# Patient Record
Sex: Male | Born: 1960 | Race: White | Hispanic: No | Marital: Married | Smoking: Current every day smoker
Health system: Southern US, Community
[De-identification: ages and names within clinical notes are randomized; demographics above are authoritative.]

## PROBLEM LIST (undated history)

## (undated) DIAGNOSIS — R270 Ataxia, unspecified: Secondary | ICD-10-CM

## (undated) DIAGNOSIS — F419 Anxiety disorder, unspecified: Secondary | ICD-10-CM

## (undated) DIAGNOSIS — J8410 Pulmonary fibrosis, unspecified: Secondary | ICD-10-CM

## (undated) DIAGNOSIS — I10 Essential (primary) hypertension: Secondary | ICD-10-CM

## (undated) DIAGNOSIS — E785 Hyperlipidemia, unspecified: Secondary | ICD-10-CM

## (undated) DIAGNOSIS — M069 Rheumatoid arthritis, unspecified: Secondary | ICD-10-CM

## (undated) DIAGNOSIS — M4800 Spinal stenosis, site unspecified: Secondary | ICD-10-CM

## (undated) DIAGNOSIS — I639 Cerebral infarction, unspecified: Secondary | ICD-10-CM

## (undated) DIAGNOSIS — J849 Interstitial pulmonary disease, unspecified: Secondary | ICD-10-CM

## (undated) HISTORY — DX: Spinal stenosis, site unspecified: M48.00

## (undated) HISTORY — PX: CARPAL TUNNEL RELEASE: SHX101

## (undated) HISTORY — DX: Pulmonary fibrosis, unspecified: J84.10

## (undated) HISTORY — DX: Cerebral infarction, unspecified: I63.9

## (undated) HISTORY — DX: Hyperlipidemia, unspecified: E78.5

## (undated) HISTORY — DX: Essential (primary) hypertension: I10

## (undated) HISTORY — DX: Ataxia, unspecified: R27.0

## (undated) HISTORY — PX: CORONARY ANGIOPLASTY: SHX604

## (undated) HISTORY — DX: Rheumatoid arthritis, unspecified: M06.9

## (undated) HISTORY — PX: CORONARY ANGIOPLASTY WITH STENT PLACEMENT: SHX49

## (undated) HISTORY — DX: Anxiety disorder, unspecified: F41.9

## (undated) HISTORY — DX: Interstitial pulmonary disease, unspecified: J84.9

---

## 2014-11-23 DIAGNOSIS — I219 Acute myocardial infarction, unspecified: Secondary | ICD-10-CM

## 2014-11-23 HISTORY — PX: CARDIAC CATHETERIZATION: SHX172

## 2014-11-23 HISTORY — DX: Acute myocardial infarction, unspecified: I21.9

## 2018-05-11 ENCOUNTER — Telehealth: Payer: Self-pay

## 2018-05-11 NOTE — Telephone Encounter (Signed)
LM for patient to call and schedule NP appointment

## 2018-06-14 ENCOUNTER — Ambulatory Visit (INDEPENDENT_AMBULATORY_CARE_PROVIDER_SITE_OTHER): Admitting: Cardiology

## 2018-06-14 ENCOUNTER — Other Ambulatory Visit: Payer: Self-pay

## 2018-06-14 ENCOUNTER — Encounter: Payer: Self-pay | Admitting: Cardiology

## 2018-06-14 VITALS — BP 137/91 | HR 69 | Ht 73.0 in | Wt 275.0 lb

## 2018-06-14 DIAGNOSIS — E782 Mixed hyperlipidemia: Secondary | ICD-10-CM | POA: Diagnosis not present

## 2018-06-14 DIAGNOSIS — I10 Essential (primary) hypertension: Secondary | ICD-10-CM

## 2018-06-14 DIAGNOSIS — I252 Old myocardial infarction: Secondary | ICD-10-CM | POA: Diagnosis not present

## 2018-06-14 DIAGNOSIS — I2510 Atherosclerotic heart disease of native coronary artery without angina pectoris: Secondary | ICD-10-CM | POA: Diagnosis not present

## 2018-06-14 DIAGNOSIS — R0989 Other specified symptoms and signs involving the circulatory and respiratory systems: Secondary | ICD-10-CM

## 2018-06-14 DIAGNOSIS — Z720 Tobacco use: Secondary | ICD-10-CM

## 2018-06-14 MED ORDER — LOSARTAN POTASSIUM 50 MG PO TABS: 50.0000 mg | ORAL_TABLET | Freq: Every day | ORAL | 2 refills | Status: DC

## 2018-06-14 MED ORDER — VARENICLINE TARTRATE 1 MG PO TABS: 1.0000 mg | ORAL_TABLET | Freq: Two times a day (BID) | ORAL | 2 refills | Status: DC

## 2018-06-14 MED ORDER — CHANTIX STARTING MONTH PAK 0.5 MG X 11 & 1 MG X 42 PO TABS: ORAL_TABLET | ORAL | 0 refills | Status: DC

## 2018-06-14 MED ORDER — NITROGLYCERIN 0.4 MG SL SUBL: 0.4000 mg | SUBLINGUAL_TABLET | SUBLINGUAL | 3 refills | Status: DC | PRN

## 2018-06-14 MED ORDER — METOPROLOL SUCCINATE ER 50 MG PO TB24: 50.0000 mg | ORAL_TABLET | Freq: Every day | ORAL | 2 refills | Status: DC

## 2018-06-14 NOTE — Progress Notes (Signed)
Primary Physician/Referring:  Carolee Rota, NP  Patient ID: Fernando Lloyd, male    DOB: [birth year], 58 y.o.   MRN: [id number]  Chief Complaint  Patient presents with  . Other    HPI: Fernando Lloyd  is a 58 y.o. male  with History of rheumatoid arthritis on Humira, long-term steroid use, history of myocardial infarctions when he presented with chest pain on 11/23/2014 and underwent emergent coronary angiography in [place] and had an occluded RCA S/P 3.5 x 28 mm DES with excellent results.  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  He presents here to establish care.  His last echocardiogram on 04/06/2016 had revealed mild decrease in LV systolic function at 15-95% without wall motion abnormality and no significant valvular abnormality. Calculated EF 50%  Presents to establish care, he lost his job but moved to [place] a few months ago and was out of all his medications.  He was sent to to see me on an urgent basis to evaluate his cardiac status.  States that he is active but does not do exercise has not had any chest pain but continues to smoke about half pack to pack of cigarettes a day, denies dyspnea or palpitations or dizziness or syncope.  Past Medical History:  Diagnosis Date  . Anxiety   . CHF (congestive heart failure) ([place])   . Hyperlipidemia   . Hypertension   . Myocardial infarct ([place])   . Rheumatoid arthritis [facility])     Past Surgical History:  Procedure Laterality Date  . CARDIAC CATHETERIZATION N/A 11/23/2014  . CARPAL TUNNEL RELEASE    . CORONARY ANGIOPLASTY     2016    Social History   Socioeconomic History  . Marital status: Married    Spouse name: Not on file  . Number of children: 1  . Years of education: Not on file  . Highest education level: Not on file  Occupational History  . Not on file  Social Needs  . Financial resource strain: Not on file  . Food insecurity   Worry: Not on file    Inability: Not on file  . Transportation needs    Medical: Not on file    Non-medical: Not on file  Tobacco Use  . Smoking status: Current Every Day Smoker    Packs/day: 0.50    Years: 25.00    Pack years: 12.50    Types: Cigarettes  . Smokeless tobacco: Never Used  Substance and Sexual Activity  . Alcohol use: Yes  . Drug use: Not on file  . Sexual activity: Not on file  Lifestyle  . Physical activity    Days per week: Not on file    Minutes per session: Not on file  . Stress: Not on file  Relationships  . Social Herbalist on phone: Not on file    Gets together: Not on file    Attends religious service: Not on file    Active member of club or organization: Not on file    Attends meetings of clubs or organizations: Not on file    Relationship status: Not on file  . Intimate partner violence    Fear of current or ex partner: Not on file    Emotionally abused: Not on file    Physically abused: Not on file    Forced sexual activity: Not on file  Other Topics Concern  . Not on file  Social History Narrative  .  Not on file    Review of Systems  Constitution: Negative for chills, decreased appetite, malaise/fatigue and weight gain.  Cardiovascular: Negative for dyspnea on exertion, leg swelling and syncope.  Endocrine: Negative for cold intolerance.  Hematologic/Lymphatic: Does not bruise/bleed easily.  Musculoskeletal: Positive for joint pain (RA). Negative for joint swelling.  Gastrointestinal: Negative for abdominal pain, anorexia, change in bowel habit, hematochezia and melena.  Neurological: Negative for headaches and light-headedness.  Psychiatric/Behavioral: Negative for depression and substance abuse.  All other systems reviewed and are negative.     Objective  Blood pressure (!) 137/91, pulse 69, height _0  (1.854 m), weight 275 lb (124.7 kg), SpO2 97 %. Body mass index is 36.28 kg/m.    Physical Exam  Constitutional: He  appears well-developed. No distress.  Moderately obese  HENT:  Head: Atraumatic.  Eyes: Conjunctivae are normal.  Neck: Neck supple. No JVD present. No thyromegaly present.  Cardiovascular: Normal rate, regular rhythm, normal heart sounds, intact distal pulses and normal pulses. Exam reveals no gallop.  No murmur heard. Pulses:      Carotid pulses are on the right side with bruit and on the left side with bruit. 2+ bilateral pitting edema  Pulmonary/Chest: Effort normal and breath sounds normal.  Abdominal: Soft. Bowel sounds are normal.  Musculoskeletal: Normal range of motion.  Neurological: He is alert.  Skin: Skin is warm and dry.  Psychiatric: He has a normal mood and affect.   Radiology: No results found.  Laboratory examination:   Not available   Medications   Current Meds  Medication Sig  . Adalimumab (HUMIRA) 10 MG/0.2ML PSKT Inject into the skin once a week.   . ALPRAZolam (XANAX) 0.25 MG tablet Take 0.25 mg by mouth 2 (two) times daily as needed.  [name] aspirin EC 81 MG tablet Take 81 mg by mouth daily.  [name] atorvastatin (LIPITOR) 40 MG tablet Take 40 mg by mouth daily.  . clopidogrel (PLAVIX) 75 MG tablet Take 75 mg by mouth daily.  [name] leflunomide (ARAVA) 20 MG tablet Take 1 tablet by mouth daily.  . predniSONE (DELTASONE) 10 MG tablet Take 1 tablet by mouth daily.  . [DISCONTINUED] amiodarone (PACERONE) 200 MG tablet Take 200 mg by mouth daily.  . [DISCONTINUED] metoprolol tartrate (LOPRESSOR) 12.5 mg TABS tablet Take 12.5 mg by mouth 2 (two) times daily.    Cardiac Studies:   [facility] [place] LV size normal, mild decrease in LV wall motion, EF 45-50%.  Calculated EF 50%.  No significant valve motion abnormality.  Coronary angiogram 11/23/2014 at [facility] in [place] S/P 3.5 x 28 mm DES from ostium to the midsegment.  Circumflex midsegment 40% stenosis, LAD normal.  Exercise sestamibi stress test  06/11/2015: Exercised for 9 minutes and achieved 10.1 mets.  There was no evidence of ischemia.  Small mild mid to base anteroseptal wall fixed defect consider wall motion artifact.  Gated images reveal EF 56%.  Assessment   1. Coronary artery disease involving native coronary artery of native heart without angina pectoris   2. Mixed hyperlipidemia   3. History of MI (myocardial infarction) 11/23/2014 S/P 3.5x28 mm DES to RCA   4. Tobacco use   5. Essential hypertension   6. Bilateral carotid bruits    Orders Placed This Encounter  Procedures  . CMP14+EGFR  . Lipid Panel With LDL/HDL Ratio  . Lipoprotein A (LPA)  . TSH  . PCV MYOCARDIAL PERFUSION WITH LEXISCAN  .  EKG 12-Lead    EKG 06/14/2018: Normal sinus rhythm with rate of 70 bpm, left axis deviation, left anterior fascicular block.  Incomplete right bundle branch block.  Recommendations:   Patient presents to evaluate for coronary artery disease, referred to me by Ms. Kerin Salen NP.  Patient has been on amiodarone since 2016 since he had a ventricular fibrillation arrest during angioplasty.  I have discontinued this. Increase metoprolol dosage, change from artery to succinate, 50 mg daily, also add losartan 50 mg daily as his hypertension is uncontrolled.  S/L NTG was prescribed and explained how to and when to use it and to notify us if there is change in frequency of use. Interaction with cialis-like agents (if applicable was discussed).   Tobacco usage patient discussed in addition to the 60 minute encounter, greater than 50% of the time spent a minute face-to-face interaction.  Tobacco use cessation discussed for additional 5 minutes, Asian is motivated, Chantix was prescribed.  He also uses Ecotrin patches.  Due to significant risk factors including him that arthritis, ongoing tobacco use disorder, uncontrolled hypertension, had been on no medications for several months, he does have increases for CAD especially in view  of rest to mid circumflex disease.  His last stress test was in 2017.  I'll consider doing a Nuclear stress test.  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 I do not want to do treadmill stress testing in view of Coreg 19.  Weight loss was also discussed at length.  I would like to see him back in 6 weeks.  Lab orders have been placed.  Adrian Prows, MD, [facility] 06/14/2018, 3:35 PM [facility]. [place] Pager: [phone] Office: [phone] If no answer Cell [phone]

## 2018-07-16 ENCOUNTER — Other Ambulatory Visit

## 2018-07-17 ENCOUNTER — Other Ambulatory Visit

## 2018-07-24 ENCOUNTER — Ambulatory Visit: Admitting: Cardiology

## 2018-11-09 ENCOUNTER — Other Ambulatory Visit: Payer: Self-pay | Admitting: Cardiology

## 2018-11-09 DIAGNOSIS — I10 Essential (primary) hypertension: Secondary | ICD-10-CM

## 2018-11-14 ENCOUNTER — Encounter: Payer: Self-pay | Admitting: *Deleted

## 2018-11-15 ENCOUNTER — Ambulatory Visit (INDEPENDENT_AMBULATORY_CARE_PROVIDER_SITE_OTHER): Admitting: Neurology

## 2018-11-15 ENCOUNTER — Other Ambulatory Visit: Payer: Self-pay

## 2018-11-15 ENCOUNTER — Encounter: Payer: Self-pay | Admitting: Neurology

## 2018-11-15 VITALS — BP 132/82 | HR 76 | Temp 97.8°F | Ht 73.0 in | Wt 262.0 lb

## 2018-11-15 DIAGNOSIS — I639 Cerebral infarction, unspecified: Secondary | ICD-10-CM

## 2018-11-15 MED ORDER — CLOPIDOGREL BISULFATE 75 MG PO TABS: 75.0000 mg | ORAL_TABLET | Freq: Every day | ORAL | 11 refills | Status: DC

## 2018-11-15 NOTE — Progress Notes (Signed)
PATIENT: Fernando Lloyd DOB: 07-04-1960  Chief Complaint  Patient presents with  . Gait Problem    Reports weakness in his right arm and right leg, along with a wobbly gait.  His symptoms started around six weeks ago and have started to mildly improve without treatment.  [name] PCP    Carolee Rota, NP     HISTORICAL  Fernando Lloyd is a 58 year old male, seen in request by his primary care nurse practitioner Carolee Rota for evaluation of sudden onset gait abnormality, initial evaluation was on November 15, 2018.  I have reviewed and summarized the referring note from the referring physician.  He has past medical history of hypertension, hyperlipidemia, coronary artery disease, smokes 1-1/2 pack a day for 30 years, drinks beers regularly.  Rheumatoid arthritis  On September 27, 2018, he was at his baseline, after sitting the chair working for few hours, when he get up, he had sudden onset dizziness, room was spinning, he has unsteady gait, he had mild slurred speech last for 2 days, he also noticed to right arm and leg weakness, which slowly improved over the past few weeks, but he still has mild unsteady gait  He has been taking aspirin daily  REVIEW OF SYSTEMS: Full 14 system review of systems performed and notable only for as above All other review of systems were negative.  ALLERGIES: No Known Allergies  HOME MEDICATIONS: Current Outpatient Medications  Medication Sig Dispense Refill  . Adalimumab (HUMIRA) 10 MG/0.2ML PSKT Inject into the skin once a week.     . ALPRAZolam (XANAX) 0.25 MG tablet Take 0.25 mg by mouth 2 (two) times daily as needed.    [name] aspirin EC 81 MG tablet Take 81 mg by mouth daily.    [name] losartan (COZAAR) 50 MG tablet Take 1 tablet by mouth once daily 30 tablet 0  . metoprolol succinate (TOPROL-XL) 50 MG 24 hr tablet TAKE 1 TABLET BY MOUTH ONCE DAILY TAKE  WITH  OR  IMMEDIATELY FOLLOWING A MEAL 30 tablet 0  . predniSONE (DELTASONE) 10 MG tablet  Take 1 tablet by mouth daily.    . rosuvastatin (CRESTOR) 40 MG tablet Take 40 mg by mouth daily.    . nitroGLYCERIN (NITROSTAT) 0.4 MG SL tablet Place 1 tablet (0.4 mg total) under the tongue every 5 (five) minutes as needed for up to 25 days for chest pain. 25 tablet 3   No current facility-administered medications for this visit.     PAST MEDICAL HISTORY: Past Medical History:  Diagnosis Date  . Anxiety   . Ataxia   . Hyperlipidemia   . Hypertension   . Myocardial infarct ([place]) 11/23/2014   RCA stent 3.5x23 DES in [place]  . Rheumatoid arthritis ([place])     PAST SURGICAL HISTORY: Past Surgical History:  Procedure Laterality Date  . CARDIAC CATHETERIZATION N/A 11/23/2014  . CARPAL TUNNEL RELEASE    . CORONARY ANGIOPLASTY     2016    FAMILY HISTORY: Family History  Problem Relation Age of Onset  . Dementia Mother   . Breast cancer Mother   . Other Father        unsure of history    SOCIAL HISTORY: Social History   Socioeconomic History  . Marital status: Married    Spouse name: Not on file  . Number of children: 1  . Years of education: some college  . Highest education level: Not on file  Occupational History  . Not on file  Social  Needs  . Financial resource strain: Not on file  . Food insecurity    Worry: Not on file    Inability: Not on file  . Transportation needs    Medical: Not on file    Non-medical: Not on file  Tobacco Use  . Smoking status: Current Every Day Smoker    Packs/day: 1.50    Years: 25.00    Pack years: 37.50    Types: Cigarettes  . Smokeless tobacco: Never Used  Substance and Sexual Activity  . Alcohol use: Yes    Comment: occasional  . Drug use: Never  . Sexual activity: Not on file  Lifestyle  . Physical activity    Days per week: Not on file    Minutes per session: Not on file  . Stress: Not on file  Relationships  . Social Musician on phone: Not on file    Gets together: Not on file    Attends religious  service: Not on file    Active member of club or organization: Not on file    Attends meetings of clubs or organizations: Not on file    Relationship status: Not on file  . Intimate partner violence    Fear of current or ex partner: Not on file    Emotionally abused: Not on file    Physically abused: Not on file    Forced sexual activity: Not on file  Other Topics Concern  . Not on file  Social History Narrative   Lives at home with his wife.   Right-handed.   1-2 cups per day.     PHYSICAL EXAM   Vitals:   11/15/18 1504  Temp: 97.8 F (36.6 C)  Weight: 262 lb (118.8 kg)  Height: 6\' 1"  (1.854 m)    Not recorded      Body mass index is 34.57 kg/m.  PHYSICAL EXAMNIATION:  Gen: NAD, conversant, well nourised, well groomed                     Cardiovascular: Regular rate rhythm, no peripheral edema, warm, nontender. Eyes: Conjunctivae clear without exudates or hemorrhage Neck: Supple, no carotid bruits. Pulmonary: Clear to auscultation bilaterally   NEUROLOGICAL EXAM:  MENTAL STATUS: Speech:    Speech is normal; fluent and spontaneous with normal comprehension.  Cognition:     Orientation to time, place and person     Normal recent and remote memory     Normal Attention span and concentration     Normal Language, naming, repeating,spontaneous speech     Fund of knowledge   CRANIAL NERVES: CN II: Visual fields are full to confrontation.  Pupils are round equal and briskly reactive to light. CN III, IV, VI: extraocular movement are normal. No ptosis. CN V: Facial sensation is intact to pinprick in all 3 divisions bilaterally. Corneal responses are intact.  CN VII: Face is symmetric with normal eye closure and smile. CN VIII: Hearing is normal to causal conversation. CN IX, X: Palate elevates symmetrically. Phonation is normal. CN XI: Head turning and shoulder shrug are intact CN XII: Tongue is midline with normal movements and no atrophy.  MOTOR: Mild right  arm pronation drift, fixation on rapid rotating movement, mild drift of the right lower extremity  REFLEXES: Hyperreflexia of right upper and lower extremity, right plantar responses were extensor  SENSORY: Intact to light touch, pinprick, positional sensation and vibratory sensation are intact in fingers and toes.  COORDINATION: Mild  dysmetria on right heel-to-shin  GAIT/STANCE: Posture is normal. Gait is steady with normal steps, base, arm swing, and turning. Heel and toe walking are normal.  Mild difficulty with tandem walking   DIAGNOSTIC DATA (LABS, IMAGING, TESTING) - I reviewed patient records, labs, notes, testing and imaging myself where available.   ASSESSMENT AND PLAN  Tarrick Carras is a 58 y.o. male   Acute onset of vertigo, gait abnormality, right arm, leg weakness, dysarthria  Most consistent with acute stroke in left brainstem/cerebellum  He has multiple vascular risk factors, hypertension, hyperlipidemia, longtime smoker, coronary artery disease,  Proceed with MRI of the brain, MRA of brain and neck,  Stop aspirin, started Plavix,  Echocardiogram  Bring laboratory evaluation at next follow-up visit   Levert Feinstein, M.D. Ph.D.  [facility] [address] [place], [place] [address] Ph: [phone] Fax: [phone]  CC: Mitzi Hansen, NP

## 2018-11-19 ENCOUNTER — Telehealth: Payer: Self-pay

## 2018-11-19 NOTE — Telephone Encounter (Signed)
Patient sent to GI via email, their phone number is [phone].

## 2018-12-09 ENCOUNTER — Other Ambulatory Visit: Payer: Self-pay | Admitting: Cardiology

## 2018-12-09 DIAGNOSIS — I10 Essential (primary) hypertension: Secondary | ICD-10-CM

## 2018-12-15 ENCOUNTER — Ambulatory Visit
Admission: RE | Admit: 2018-12-15 | Discharge: 2018-12-15 | Disposition: A | Discharge status: Home / Self Care | Source: Ambulatory Visit | Attending: Neurology | Admitting: Neurology

## 2018-12-15 ENCOUNTER — Other Ambulatory Visit: Payer: Self-pay

## 2018-12-15 DIAGNOSIS — I639 Cerebral infarction, unspecified: Secondary | ICD-10-CM

## 2018-12-15 MED ORDER — GADOBENATE DIMEGLUMINE 529 MG/ML IV SOLN
20.0000 mL | Freq: Once | INTRAVENOUS | Status: AC | PRN
  Administered 2018-12-15: 20 mL via INTRAVENOUS

## 2018-12-17 ENCOUNTER — Telehealth: Payer: Self-pay | Admitting: Neurology

## 2018-12-17 NOTE — Telephone Encounter (Signed)
IMPRESSION: This MRI of the brain without contrast shows the following: 1.  Chronic lacunar infarction in the left mid pons. 2.  Scattered T2/flair hyperintense foci in the hemispheres, predominantly in the deep white matter consistent with chronic microvascular ischemic change. 3.  There were no acute findings on the study.   Please call patient, MRI of the brain showed lacunar infarction in the left mid pons, this can explain her for sudden onset of the right-sided weakness, slurred speech, dizziness in September  MRI of the brain also showed supratentorium small vessel disease.  MRA of the neck showed mild proximal intimal carotid artery stenosis, right vertebral artery is dominant  MRA of the head showed no large vessel disease.  Should continue Plavix daily

## 2018-12-17 NOTE — Telephone Encounter (Signed)
I spoke to the patient and reviewed the results below.  He verbalized understanding and will continue his Plavix.  He is also taking his blood pressure and cholesterol medications, as prescribed.  He will keep his pending follow up here.

## 2019-01-06 ENCOUNTER — Other Ambulatory Visit: Payer: Self-pay | Admitting: Cardiology

## 2019-01-06 DIAGNOSIS — I10 Essential (primary) hypertension: Secondary | ICD-10-CM

## 2019-01-09 ENCOUNTER — Telehealth: Payer: Self-pay

## 2019-02-10 ENCOUNTER — Other Ambulatory Visit: Payer: Self-pay | Admitting: Cardiology

## 2019-02-10 DIAGNOSIS — I10 Essential (primary) hypertension: Secondary | ICD-10-CM

## 2019-02-15 ENCOUNTER — Other Ambulatory Visit: Payer: Self-pay

## 2019-02-21 ENCOUNTER — Ambulatory Visit: Admitting: Adult Health

## 2019-03-16 ENCOUNTER — Ambulatory Visit: Attending: Internal Medicine

## 2019-03-16 DIAGNOSIS — Z23 Encounter for immunization: Secondary | ICD-10-CM

## 2019-03-16 NOTE — Progress Notes (Signed)
   Covid-19 Vaccination Clinic  Name:  Fernando Lloyd    MRN: [id number] DOB: 08-17-1960  03/16/2019  [name] was observed post Covid-19 immunization for 15 minutes without incident. He was provided with Vaccine Information Sheet and instruction to access the V-Safe system.   [name] was instructed to call 911 with any severe reactions post vaccine: [name] Difficulty breathing  . Swelling of face and throat  . A fast heartbeat  . A bad rash all over body  . Dizziness and weakness   Immunizations Administered    Name Date Dose VIS Date Route   Pfizer COVID-19 Vaccine 03/16/2019  1:52 PM 0.3 mL 12/14/2018 Intramuscular   Manufacturer: [employer], [employer]   Lot: [id number]   NDC: [address]-2

## 2019-03-24 ENCOUNTER — Other Ambulatory Visit: Payer: Self-pay | Admitting: Cardiology

## 2019-03-24 DIAGNOSIS — I10 Essential (primary) hypertension: Secondary | ICD-10-CM

## 2019-04-09 ENCOUNTER — Ambulatory Visit: Attending: Internal Medicine

## 2019-04-09 DIAGNOSIS — Z23 Encounter for immunization: Secondary | ICD-10-CM

## 2019-04-09 NOTE — Progress Notes (Signed)
   Covid-19 Vaccination Clinic  Name:  Fernando Lloyd    MRN: [id number] DOB: 1960-04-29  04/09/2019  Mr. [name] was observed post Covid-19 immunization for 15 minutes without incident. He was provided with Vaccine Information Sheet and instruction to access the V-Safe system.   Mr. [name] was instructed to call 911 with any severe reactions post vaccine: [name] Difficulty breathing  . Swelling of face and throat  . A fast heartbeat  . A bad rash all over body  . Dizziness and weakness   Immunizations Administered    Name Date Dose VIS Date Route   Pfizer COVID-19 Vaccine 04/09/2019  1:08 PM 0.3 mL 12/14/2018 Intramuscular   Manufacturer: [employer], [employer]   Lot: [id number]   NDC: [address]-5

## 2019-07-06 ENCOUNTER — Other Ambulatory Visit: Payer: Self-pay | Admitting: Cardiology

## 2019-08-24 ENCOUNTER — Other Ambulatory Visit: Payer: Self-pay | Admitting: Cardiology

## 2019-08-24 DIAGNOSIS — I10 Essential (primary) hypertension: Secondary | ICD-10-CM

## 2019-10-25 ENCOUNTER — Emergency Department (HOSPITAL_COMMUNITY)
Admission: EM | Admit: 2019-10-25 | Discharge: 2019-10-25 | Disposition: A | Discharge status: Home / Self Care | Attending: Emergency Medicine | Admitting: Emergency Medicine

## 2019-10-25 ENCOUNTER — Emergency Department (HOSPITAL_COMMUNITY)

## 2019-10-25 ENCOUNTER — Other Ambulatory Visit: Payer: Self-pay

## 2019-10-25 ENCOUNTER — Encounter (HOSPITAL_COMMUNITY): Payer: Self-pay | Admitting: Pharmacy Technician

## 2019-10-25 DIAGNOSIS — I10 Essential (primary) hypertension: Secondary | ICD-10-CM | POA: Insufficient documentation

## 2019-10-25 DIAGNOSIS — Z20822 Contact with and (suspected) exposure to covid-19: Secondary | ICD-10-CM | POA: Insufficient documentation

## 2019-10-25 DIAGNOSIS — Z79899 Other long term (current) drug therapy: Secondary | ICD-10-CM | POA: Insufficient documentation

## 2019-10-25 DIAGNOSIS — R0789 Other chest pain: Secondary | ICD-10-CM | POA: Diagnosis present

## 2019-10-25 DIAGNOSIS — Z7982 Long term (current) use of aspirin: Secondary | ICD-10-CM | POA: Insufficient documentation

## 2019-10-25 DIAGNOSIS — Z8673 Personal history of transient ischemic attack (TIA), and cerebral infarction without residual deficits: Secondary | ICD-10-CM | POA: Diagnosis not present

## 2019-10-25 DIAGNOSIS — F17210 Nicotine dependence, cigarettes, uncomplicated: Secondary | ICD-10-CM | POA: Insufficient documentation

## 2019-10-25 DIAGNOSIS — I2510 Atherosclerotic heart disease of native coronary artery without angina pectoris: Secondary | ICD-10-CM | POA: Diagnosis not present

## 2019-10-25 DIAGNOSIS — R079 Chest pain, unspecified: Secondary | ICD-10-CM

## 2019-10-25 LAB — RESPIRATORY PANEL BY RT PCR (FLU A&B, COVID)
Influenza A by PCR: NEGATIVE
Influenza B by PCR: NEGATIVE
SARS Coronavirus 2 by RT PCR: NEGATIVE

## 2019-10-25 LAB — CBC
HCT: 45.5 % (ref 39.0–52.0)
Hemoglobin: 15.1 g/dL (ref 13.0–17.0)
MCH: 33.2 pg (ref 26.0–34.0)
MCHC: 33.2 g/dL (ref 30.0–36.0)
MCV: 100 fL (ref 80.0–100.0)
Platelets: 307 10*3/uL (ref 150–400)
RBC: 4.55 MIL/uL (ref 4.22–5.81)
RDW: 12.4 % (ref 11.5–15.5)
WBC: 15.3 10*3/uL — ABNORMAL HIGH (ref 4.0–10.5)
nRBC: 0 % (ref 0.0–0.2)

## 2019-10-25 LAB — TROPONIN I (HIGH SENSITIVITY)
Troponin I (High Sensitivity): 8 ng/L (ref <18)
Troponin I (High Sensitivity): 8 ng/L (ref <18)

## 2019-10-25 LAB — BASIC METABOLIC PANEL
Anion gap: 9 (ref 5–15)
BUN: 12 mg/dL (ref 6–20)
CO2: 23 mmol/L (ref 22–32)
Calcium: 9.3 mg/dL (ref 8.9–10.3)
Chloride: 107 mmol/L (ref 98–111)
Creatinine, Ser: 1.15 mg/dL (ref 0.61–1.24)
GFR, Estimated: >60 mL/min (ref >60)
Glucose, Bld: 90 mg/dL (ref 70–99)
Potassium: 4.2 mmol/L (ref 3.5–5.1)
Sodium: 139 mmol/L (ref 135–145)

## 2019-10-25 LAB — BRAIN NATRIURETIC PEPTIDE: B Natriuretic Peptide: 61.2 pg/mL (ref 0.0–100.0)

## 2019-10-25 MED ORDER — PANTOPRAZOLE SODIUM 40 MG IV SOLR
40.0000 mg | Freq: Once | INTRAVENOUS | Status: AC
  Administered 2019-10-25: 40 mg via INTRAVENOUS
  Filled 2019-10-25: qty 40

## 2019-10-25 MED ORDER — PANTOPRAZOLE SODIUM 20 MG PO TBEC: 20.0000 mg | DELAYED_RELEASE_TABLET | Freq: Every day | ORAL | 0 refills | Status: DC

## 2019-10-25 MED ORDER — ISOSORBIDE MONONITRATE ER 60 MG PO TB24: 60.0000 mg | ORAL_TABLET | Freq: Every day | ORAL | 0 refills | Status: AC

## 2019-10-25 MED ORDER — ISOSORBIDE MONONITRATE ER 30 MG PO TB24
60.0000 mg | ORAL_TABLET | Freq: Every day | ORAL | Status: DC
  Administered 2019-10-25: 60 mg via ORAL
  Filled 2019-10-25: qty 2

## 2019-10-25 NOTE — ED Triage Notes (Signed)
Uncomfortable chest pain, pt reports dizziness (stiff feeling on left side of chest). Pt denies sob.

## 2019-10-25 NOTE — ED Provider Notes (Addendum)
[facility] EMERGENCY DEPARTMENT Provider Note   CSN: [id number] Arrival date & time: 10/25/19  0626     History Chief Complaint  Patient presents with  . Chest Pain    Fernando Lloyd is a 59 y.o. 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  Additionally patient reports that over the past several weeks he has developed some lightheadedness whenever he exerts himself.  Denies fall/injury, fever/chills, headache, vision changes, neck pain, cough/shortness of breath, hemoptysis, abdominal pain, nausea/vomiting, diarrhea, numbness, new weakness or any additional concerns.  Of note patient has had three doses Covid vaccine last dose around 2 weeks ago.  HPI     Past Medical History:  Diagnosis Date  . Anxiety   . Ataxia   . Hyperlipidemia   . Hypertension   . Myocardial infarct (HCC) 11/23/2014   RCA stent 3.5x23 DES in [place]  . Rheumatoid arthritis [facility])     Patient Active Problem List   Diagnosis Date Noted  . Cerebrovascular accident (CVA) (HCC) 11/15/2018  . Coronary artery disease involving native coronary artery of native heart without angina pectoris 06/14/2018    Past Surgical History:  Procedure Laterality Date  . CARDIAC CATHETERIZATION N/A 11/23/2014  . CARPAL TUNNEL RELEASE    . CORONARY ANGIOPLASTY     2016       Family History  Problem Relation Age of Onset  . Dementia Mother   . Breast cancer Mother   . Other Father        unsure of history    Social History   Tobacco Use  . Smoking status:  Current Every Day Smoker    Packs/day: 1.50    Years: 25.00    Pack years: 37.50    Types: Cigarettes  . Smokeless tobacco: Never Used  Vaping Use  . Vaping Use: Never used  Substance Use Topics  . Alcohol use: Yes    Comment: occasional  . Drug use: Never    Home Medications Prior to Admission medications   Medication Sig Start Date End Date Taking? Authorizing Provider  ALPRAZolam (XANAX) 0.25 MG tablet Take 0.25 mg by mouth See admin instructions. Take 0.25 mg by mouth one to two times a day as needed for anxiety 04/30/18  Yes [provider]  aspirin (BAYER ASPIRIN) 325 MG EC tablet Take 325 mg by mouth daily.   Yes [provider]  clopidogrel (PLAVIX) 75 MG tablet Take 1 tablet (75 mg total) by mouth daily. 11/15/18  Yes Levert Feinstein, MD  losartan (COZAAR) 50 MG tablet Take 1 tablet by mouth once daily Patient taking differently: Take 50 mg by mouth daily.  08/26/19  Yes Yates Decamp, MD  metoprolol succinate (TOPROL-XL) 50 MG 24 hr tablet TAKE 1 TABLET BY MOUTH ONCE DAILY WITH A MEAL OR  IMMEDIATELY  FOLLOWING  A  MEAL Patient taking differently: Take 50 mg by mouth daily.  07/10/19  Yes Yates Decamp, MD  predniSONE (DELTASONE) 10 MG tablet Take 10 mg by mouth daily as needed (AS DIRECTED).  05/12/18  Yes [provider]  RINVOQ 15 MG TB24 Take 15 mg by  mouth daily.  10/18/19  Yes [provider]  rosuvastatin (CRESTOR) 40 MG tablet Take 40 mg by mouth at bedtime.    Yes [provider]  Adalimumab (HUMIRA) 10 MG/0.2ML PSKT Inject into the skin once a week.  Patient not taking: Reported on 10/25/2019    [provider]  gabapentin (NEURONTIN) 300 MG capsule Take 300 mg by mouth daily. 10/08/19   [provider]  isosorbide mononitrate (IMDUR) 60 MG 24 hr tablet Take 1 tablet (60 mg total) by mouth daily. 10/25/19 11/24/19  Harlene Salts A, PA-C  nitroGLYCERIN (NITROSTAT) 0.4 MG SL tablet Place 1 tablet (0.4 mg total) under the  tongue every 5 (five) minutes as needed for up to 25 days for chest pain. 06/14/18 10/25/19  Yates Decamp, MD  pantoprazole (PROTONIX) 20 MG tablet Take 1 tablet (20 mg total) by mouth daily. 10/25/19   Bill Salinas, PA-C    Allergies    Patient has no known allergies.  Review of Systems   Review of Systems Ten systems are reviewed and are negative for acute change except as noted in the HPI  Physical Exam Updated Vital Signs BP 110/72 (BP Location: Right Arm)   Pulse (!) 53   Temp 98.7 F (37.1 C) (Oral)   Resp 18   Ht 6\' 1"  (1.854 m)   Wt 104.3 kg   SpO2 98%   BMI 30.34 kg/m   Physical Exam Constitutional:      General: He is not in acute distress.    Appearance: Normal appearance. He is well-developed. He is not ill-appearing or diaphoretic.  HENT:     Head: Normocephalic and atraumatic.  Eyes:     General: Vision grossly intact. Gaze aligned appropriately.     Pupils: Pupils are equal, round, and reactive to light.  Neck:     Trachea: Trachea and phonation normal.  Cardiovascular:     Rate and Rhythm: Normal rate and regular rhythm.  Pulmonary:     Effort: Pulmonary effort is normal. No respiratory distress.     Breath sounds: Normal breath sounds.  Abdominal:     General: There is no distension.     Palpations: Abdomen is soft.     Tenderness: There is no abdominal tenderness. There is no guarding or rebound.  Musculoskeletal:        General: Normal range of motion.     Cervical back: Normal range of motion.     Right lower leg: No edema.     Left lower leg: No edema.  Skin:    General: Skin is warm and dry.  Neurological:     Mental Status: He is alert.     GCS: GCS eye subscore is 4. GCS verbal subscore is 5. GCS motor subscore is 6.     Comments: Speech is clear and goal oriented, follows commands Major Cranial nerves without deficit, no facial droop Moves extremities without ataxia, coordination intact  Psychiatric:        Behavior: Behavior  normal.     ED Results / Procedures / Treatments   Labs (all labs ordered are listed, but only abnormal results are displayed) Labs Reviewed  CBC - Abnormal; Notable for the following components:      Result Value   WBC 15.3 (*)    All other components within normal limits  RESPIRATORY PANEL BY RT PCR (FLU A&B, COVID)  BASIC METABOLIC PANEL  BRAIN NATRIURETIC PEPTIDE  TROPONIN I (HIGH SENSITIVITY)  TROPONIN I (  HIGH SENSITIVITY)    EKG EKG Interpretation  Date/Time:  Friday October 25 2019 07:56:04 EDT Ventricular Rate:  60 PR Interval:  170 QRS Duration: 90 QT Interval:  406 QTC Calculation: 406 R Axis:   -19 Text Interpretation: Normal sinus rhythm Normal ECG No STEMI Confirmed by Alvester Chou [phone]) on 10/25/2019 8:05:04 AM Also confirmed by Alvester Chou [phone]), editor Elita Quick [phone])  on 10/25/2019 10:02:48 AM   Radiology DG Chest 2 View  Result Date: 10/25/2019 CLINICAL DATA:  Chest pain EXAM: CHEST - 2 VIEW COMPARISON:  None. FINDINGS: Patchy left perihilar and bibasilar opacities. Diffuse interstitial prominence. No pneumothorax or pleural effusion. Cardiomediastinal silhouette within normal limits. No acute osseous abnormality. IMPRESSION: Left perihilar/bibasilar opacities and interstitial prominence. Differential includes infection, edema or atelectasis. Electronically Signed   By: Stana Bunting M.D.   On: 10/25/2019 08:26    Procedures Procedures (including critical care time)  Medications Ordered in ED Medications  isosorbide mononitrate (IMDUR) 24 hr tablet 60 mg (60 mg Oral Given 10/25/19 1343)  pantoprazole (PROTONIX) injection 40 mg (40 mg Intravenous Given 10/25/19 1343)    ED Course  I have reviewed the triage vital signs and the nursing notes.  Pertinent labs & imaging results that were available during my care of the patient were reviewed by me and considered in my medical decision making (see chart for  details).  Clinical Course as of Oct 25 1455  Fri Oct 25, 2019  1330 60 imdur and PPI   [BM]    Clinical Course User Index [BM] Elizabeth Palau   MDM Rules/Calculators/A&P                         Additional history obtained from: 1. Nursing notes from this visit. 2. Review of electronic medical records. ------------------- 59 year old male presents with left chest pain after exertion began yesterday chest pain greatly improved.  Radiates down left arm.  He has been experiencing exertional lightheadedness and fatigue for the past few weeks.  He has a history of CAD with stent placement in [place] 5 years ago.  He is still smoking.  Cardiac work-up was started in waiting room, significant for two high-sensitivity troponins within normal limits without elevations.  No anemia.  No electrolyte derangement, AKI or gap.  Mild leukocytosis of 15.3.  Chest x-ray shows left perihilar and bibasilar opacities and interstitial prominence.  Patient does have a leukocytosis but no fever tachycardia or infectious type symptoms, Covid test added low suspicion for infection at this time.  Will add on a BNP.  EKG without acute ischemic changes.  Concern for angina, consult placed to cardiology. Discussed case with Dr. Rubin Payor who agrees.  Additionally pain is reported to be somewhat improved with motion at the left shoulder, possible musculoskeletal etiology but feel with patient's cardiac history will need cardiology input.  Additionally history and presentation is inconsistent with dissection, PE, PTX or other emergent cardiopulmonary etiologies at this time. ------- 1:30 PM: Discussed case with Dr. Jacinto Halim, advised to give patient 60 mg Imdur now along with a PPI.  Around 30 minutes or so afterwards can be discharged.  Dr. Verl Dicker office will contact patient to schedule a follow-up appointment.  Additionally discussed chest x-ray findings, low suspicion for fluid overload or infection suspect chronic from  smoking, Dr. Jacinto Halim advises his office will follow up. - BNP within normal limits Covid/influenza panel negative  Patient reassessed, resting comfortably no acute distress wife at  bedside.  Vital signs reassuring.  He is requesting discharge.  Plan of care is discharged with 60 mg of Imdur daily and Protonix.  Patient aware that he will need to follow-up with Dr. Verl Dicker office, to call today to schedule follow-up appointment.    At this time there does not appear to be any evidence of an acute emergency medical condition and the patient appears stable for discharge with appropriate outpatient follow up. Diagnosis was discussed with patient who verbalizes understanding of care plan and is agreeable to discharge. I have discussed return precautions with patient and wife who verbalizes understanding. Patient encouraged to follow-up with their PCP and cardiology. All questions answered.  Patient's case discussed with Dr. Rubin Payor who agrees with plan to discharge with Imdur/Protonix and follow-up.   Note: Portions of this report may have been transcribed using voice recognition software. Every effort was made to ensure accuracy; however, inadvertent computerized transcription errors may still be present. Final Clinical Impression(s) / ED Diagnoses Final diagnoses:  Chest pain, unspecified type    Rx / DC Orders ED Discharge Orders         Ordered    pantoprazole (PROTONIX) 20 MG tablet  Daily        10/25/19 1439    isosorbide mononitrate (IMDUR) 60 MG 24 hr tablet  Daily        10/25/19 1439           Bill Salinas, PA-C 10/25/19 1457    Elizabeth Palau 10/25/19 1458    Benjiman Core, MD 10/25/19 1545

## 2019-10-25 NOTE — Discharge Instructions (Addendum)
At this time there does not appear to be the presence of an emergent medical condition, however there is always the potential for conditions to change. Please read and follow the below instructions.  Please return to the Emergency Department immediately for any new or worsening symptoms. Please be sure to follow up with your Primary Care Provider within one week regarding your visit today; please call their office to schedule an appointment even if you are feeling better for a follow-up visit. Begin taking the medications Imdur and Protonix as prescribed. Dr. Verl Dicker office should be scheduling a follow-up appointment with you for further evaluation of your symptoms today.  If you do not hear from his office before the end of the work day call his office to ensure they schedule a follow-up appointment for you.  Go to the nearest Emergency Department immediately if: You have fever or chills You have a cough that gets worse, or you cough up blood. You have very bad (severe) pain in your belly (abdomen). You pass out (faint). You have either of these for no clear reason: Sudden chest discomfort. Sudden discomfort in your arms, back, neck, or jaw. You have shortness of breath at any time. You suddenly start to sweat, or your skin gets clammy. You feel sick to your stomach (nauseous). You throw up (vomit). You suddenly feel lightheaded or dizzy. You feel very weak or tired. Your heart starts to beat fast, or it feels like it is skipping beats. You have any new/concerning or worsening of symptoms  Please read the additional information packets attached to your discharge summary.  Do not take your medicine if  develop an itchy rash, swelling in your mouth or lips, or difficulty breathing; call 911 and seek immediate emergency medical attention if this occurs.  You may review your lab tests and imaging results in their entirety on your MyChart account.  Please discuss all results of fully with  your primary care provider and other specialist at your follow-up visit.  Note: Portions of this text may have been transcribed using voice recognition software. Every effort was made to ensure accuracy; however, inadvertent computerized transcription errors may still be present.

## 2019-10-26 ENCOUNTER — Other Ambulatory Visit: Payer: Self-pay | Admitting: Cardiology

## 2019-10-26 ENCOUNTER — Telehealth: Payer: Self-pay | Admitting: Cardiology

## 2019-10-28 NOTE — Telephone Encounter (Signed)
Patient was reached- vml for pt to callback and sch a follow up with dr Jacinto Halim  -Cleotis Nipper

## 2019-12-13 ENCOUNTER — Other Ambulatory Visit: Payer: Self-pay | Admitting: Neurology

## 2019-12-19 ENCOUNTER — Other Ambulatory Visit: Payer: Self-pay | Admitting: *Deleted

## 2019-12-19 DIAGNOSIS — I739 Peripheral vascular disease, unspecified: Secondary | ICD-10-CM

## 2020-01-02 ENCOUNTER — Ambulatory Visit (HOSPITAL_COMMUNITY)
Admission: RE | Admit: 2020-01-02 | Discharge: 2020-01-02 | Disposition: A | Discharge status: Home / Self Care | Source: Ambulatory Visit | Attending: Vascular Surgery | Admitting: Vascular Surgery

## 2020-01-02 ENCOUNTER — Ambulatory Visit: Admitting: Vascular Surgery

## 2020-01-02 ENCOUNTER — Other Ambulatory Visit: Payer: Self-pay

## 2020-01-02 ENCOUNTER — Encounter: Payer: Self-pay | Admitting: Vascular Surgery

## 2020-01-02 VITALS — BP 124/71 | HR 73 | Temp 98.7°F | Resp 20 | Ht 73.0 in | Wt 244.0 lb

## 2020-01-02 DIAGNOSIS — I739 Peripheral vascular disease, unspecified: Secondary | ICD-10-CM

## 2020-01-02 NOTE — Progress Notes (Signed)
Referring Physician: Dr Shelle Iron  Patient name: Fernando Lloyd MRN: [id number] DOB: 04-18-1960 Sex: male  REASON FOR CONSULT: Claudication  HPI: Fernando Lloyd is a 59 y.o. male, with a several year history of slowly progressive pain primarily in the hips and buttocks but also in the calf occurring after walking about a 1/4 mile distance.  He also has pain that occurs in the hips at nighttime that forces him sometimes to sleep in a chair.  He does not really describe rest pain in the feet.  He has no nonhealing wounds on his legs.  He had a recent MRI of the back which showed some mild disc disease but no significant lumbar stenosis according to Dr. Veronda Prude note.  Those images are not available for review today.  He currently smokes about a pack and a half of cigarettes per day.  He has had a stroke in the past with mild right upper extremity weakness.  Other medical problems include coronary artery disease with previous right coronary stenting in 2016, hypertension, hyperlipidemia all of which have been stable.  He is currently on Plavix aspirin and a statin.  His cardiologist is Dr. Jacinto Halim.  He is also on prednisone and Humira for chronic rheumatoid arthritis.  All of these medical problems are currently stable.  Past Medical History:  Diagnosis Date  . Anxiety   . Ataxia   . Hyperlipidemia   . Hypertension   . Myocardial infarct (HCC) 11/23/2014   RCA stent 3.5x23 DES in [place]  . Rheumatoid arthritis [facility])    Past Surgical History:  Procedure Laterality Date  . CARDIAC CATHETERIZATION N/A 11/23/2014  . CARPAL TUNNEL RELEASE    . CORONARY ANGIOPLASTY     2016    Family History  Problem Relation Age of Onset  . Dementia Mother   . Breast cancer Mother   . Other Father        unsure of history    SOCIAL HISTORY: Social History   Socioeconomic History  . Marital status: Married    Spouse name: Not on file  . Number of children: 1  . Years of education: some college  .  Highest education level: Not on file  Occupational History  . Not on file  Tobacco Use  . Smoking status: Current Every Day Smoker    Packs/day: 1.50    Years: 25.00    Pack years: 37.50    Types: Cigarettes  . Smokeless tobacco: Never Used  Vaping Use  . Vaping Use: Never used  Substance and Sexual Activity  . Alcohol use: Yes    Comment: occasional  . Drug use: Never  . Sexual activity: Not on file  Other Topics Concern  . Not on file  Social History Narrative   Lives at home with his wife.   Right-handed.   1-2 cups per day.   Social Determinants of Health   Financial Resource Strain: Not on file  Food Insecurity: Not on file  Transportation Needs: Not on file  Physical Activity: Not on file  Stress: Not on file  Social Connections: Not on file  Intimate Partner Violence: Not on file    No Known Allergies  Current Outpatient Medications  Medication Sig Dispense Refill  . Adalimumab (HUMIRA) 10 MG/0.2ML PSKT Inject into the skin once a week.  (Patient not taking: Reported on 10/25/2019)    . ALPRAZolam (XANAX) 0.25 MG tablet Take 0.25 mg by mouth See admin instructions. Take 0.25 mg by mouth one  to two times a day as needed for anxiety    . aspirin (BAYER ASPIRIN) 325 MG EC tablet Take 325 mg by mouth daily.    . clopidogrel (PLAVIX) 75 MG tablet Take 1 tablet (75 mg total) by mouth daily. 30 tablet 11  . gabapentin (NEURONTIN) 300 MG capsule Take 300 mg by mouth daily.    . isosorbide mononitrate (IMDUR) 60 MG 24 hr tablet Take 1 tablet (60 mg total) by mouth daily. 30 tablet 0  . losartan (COZAAR) 50 MG tablet Take 1 tablet by mouth once daily (Patient taking differently: Take 50 mg by mouth daily. ) 90 tablet 0  . metoprolol succinate (TOPROL-XL) 50 MG 24 hr tablet TAKE 1 TABLET BY MOUTH ONCE DAILY WITH A MEAL OR  IMMEDIATELY  FOLLOWING  A  MEAL 90 tablet 0  . nitroGLYCERIN (NITROSTAT) 0.4 MG SL tablet Place 1 tablet (0.4 mg total) under the tongue every 5 (five)  minutes as needed for up to 25 days for chest pain. 25 tablet 3  . pantoprazole (PROTONIX) 20 MG tablet Take 1 tablet (20 mg total) by mouth daily. 30 tablet 0  . predniSONE (DELTASONE) 10 MG tablet Take 10 mg by mouth daily as needed (AS DIRECTED).     [name] RINVOQ 15 MG TB24 Take 15 mg by mouth daily.     . rosuvastatin (CRESTOR) 40 MG tablet Take 40 mg by mouth at bedtime.      No current facility-administered medications for this visit.    ROS:   General:  No weight loss, Fever, chills  HEENT: No recent headaches, no nasal bleeding, no visual changes, no sore throat  Neurologic: No dizziness, blackouts, seizures. No recent symptoms of stroke or mini- stroke. No recent episodes of slurred speech, or temporary blindness.  Cardiac: No recent episodes of chest pain/pressure, no shortness of breath at rest.  No shortness of breath with exertion.  Denies history of atrial fibrillation or irregular heartbeat  Vascular: No history of rest pain in feet.  No history of claudication.  No history of non-healing ulcer, No history of DVT   Pulmonary: No home oxygen, no productive cough, no hemoptysis,  No asthma or wheezing  Musculoskeletal:  [X]  Arthritis, [X]  Low back pain,  [X]  Joint pain  Hematologic:No history of hypercoagulable state.  No history of easy bleeding.  No history of anemia  Gastrointestinal: No hematochezia or melena,  No gastroesophageal reflux, no trouble swallowing  Urinary: [ ]  chronic Kidney disease, [ ]  on HD - [ ]  MWF or [ ]  TTHS, [ ]  Burning with urination, [ ]  Frequent urination, [ ]  Difficulty urinating;   Skin: No rashes  Psychological: No history of anxiety,  No history of depression   Physical Examination   Vitals:   01/02/20 0950  BP: 124/71  Pulse: 73  Resp: 20  Temp: 98.7 F (37.1 C)  SpO2: 95%  Weight: 244 lb (110.7 kg)  Height: 6\' 1"  (1.854 m)    General:  Alert and oriented, no acute distress HEENT: Normal Neck: No JVD Cardiac: Regular  Rate and Rhythm Abdomen: Soft, non-tender, non-distended, no mass Skin: No rash or ulcer Extremity Pulses:  2+ radial, brachial, femoral, absent popliteal dorsalis pedis, posterior tibial pulses bilaterally Musculoskeletal: No deformity trace pretibial ankle and pedal edema bilaterally  Neurologic: Upper and lower extremity motor 5/5 and symmetric  DATA:  Patient had bilateral ABIs performed today which were triphasic greater than 1.  ASSESSMENT: I agree with Dr. .  The patient has some symptoms that are certainly suggestive of musculoskeletal disease but also could be consistent with claudication.  He does not have palpable lower extremity pulses below the femoral region.  He may have a component of aortoiliac occlusive disease contributing to his symptoms.  I did discuss with the patient today that the symptoms he is experiencing at nighttime with shifting pain in his hips is probably more orthopedic or musculoskeletal in nature.  However the pain he is experiencing with walking could potentially be related to arterial occlusive disease.  Although his ABIs today were normal this may be a demand type symptom especially if he has aortoiliac occlusive disease.  He does have a history of coronary artery disease and certainly with his smoking history is at risk for atherosclerotic lower extremity occlusive disease.   PLAN: To completely rule out any vascular etiology of his current symptoms we will obtain a CT angiogram of the abdomen and pelvis with lower extremity runoff.  Patient will see me back next week after his CT scan.   Fabienne Bruns, MD [facility] Office: [phone]

## 2020-01-08 ENCOUNTER — Ambulatory Visit (HOSPITAL_COMMUNITY)
Admission: RE | Admit: 2020-01-08 | Discharge: 2020-01-08 | Disposition: A | Discharge status: Home / Self Care | Source: Ambulatory Visit | Attending: Vascular Surgery | Admitting: Vascular Surgery

## 2020-01-08 ENCOUNTER — Other Ambulatory Visit: Payer: Self-pay

## 2020-01-08 DIAGNOSIS — I739 Peripheral vascular disease, unspecified: Secondary | ICD-10-CM | POA: Diagnosis not present

## 2020-01-08 MED ORDER — IOHEXOL 350 MG/ML SOLN
100.0000 mL | Freq: Once | INTRAVENOUS | Status: AC | PRN
  Administered 2020-01-08: 100 mL via INTRAVENOUS

## 2020-01-09 ENCOUNTER — Encounter: Payer: Self-pay | Admitting: Vascular Surgery

## 2020-01-09 ENCOUNTER — Ambulatory Visit (INDEPENDENT_AMBULATORY_CARE_PROVIDER_SITE_OTHER): Admitting: Vascular Surgery

## 2020-01-09 VITALS — BP 133/78 | HR 56 | Temp 98.4°F | Resp 20 | Ht 73.0 in | Wt 244.0 lb

## 2020-01-09 DIAGNOSIS — I739 Peripheral vascular disease, unspecified: Secondary | ICD-10-CM

## 2020-01-09 NOTE — Progress Notes (Signed)
Patient is a 60 year old male who returns for follow-up today.  His primary complaint at his last office visit was related to hip and buttock claudication type symptoms.  He previously had a back evaluation by Dr. Jillyn Hidden.  He did have some mild degenerative disc disease but overall it was thought this was not the complete answer to his problem.  He returns today after a CT angiogram of the abdomen and pelvis with lower extremity runoff.    I reviewed and interpreted the images of that CT today.  In the CT scan he has scattered areas of atherosclerosis with no flow-limiting stenosis in the aortoiliac segment.  Specifically the aorta left and right common iliac external iliac common superficial deep femoral popliteal and tibial vessels are all patent.  He did have subtotal occlusion of the right internal iliac artery and also narrowing of the left internal iliac artery.  I discussed with the patient today the findings of his CT scan.  Certainly internal iliac artery narrowing can cause some buttock claudication.  There is not really a revascularization procedure for this.  This is probably also contributed to his symptoms of erectile dysfunction and this was also discussed with the patient today.  Primary treatment plan is for walking 30 minutes daily and trying to quit smoking cigarettes.  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  He will try to quit smoking.  He did have some occasional lower extremity edema will compression stockings for this.  We could evaluate this further if this becomes more progressive over time.  This is not related to his arterial occlusive  disease.  He will follow up with on an as-needed basis.  Imaging findings and overall plan was discussed with the patient and his wife was on the phone during this discussion.  [place], MD [facility] Office: [phone]

## 2021-01-22 IMAGING — CR DG CHEST 2V
2 series · 2 of 2 positions shown · non-contrast
Comparison: None.

CLINICAL DATA: Chest pain

EXAM:
CHEST - 2 VIEW

[chest pa]
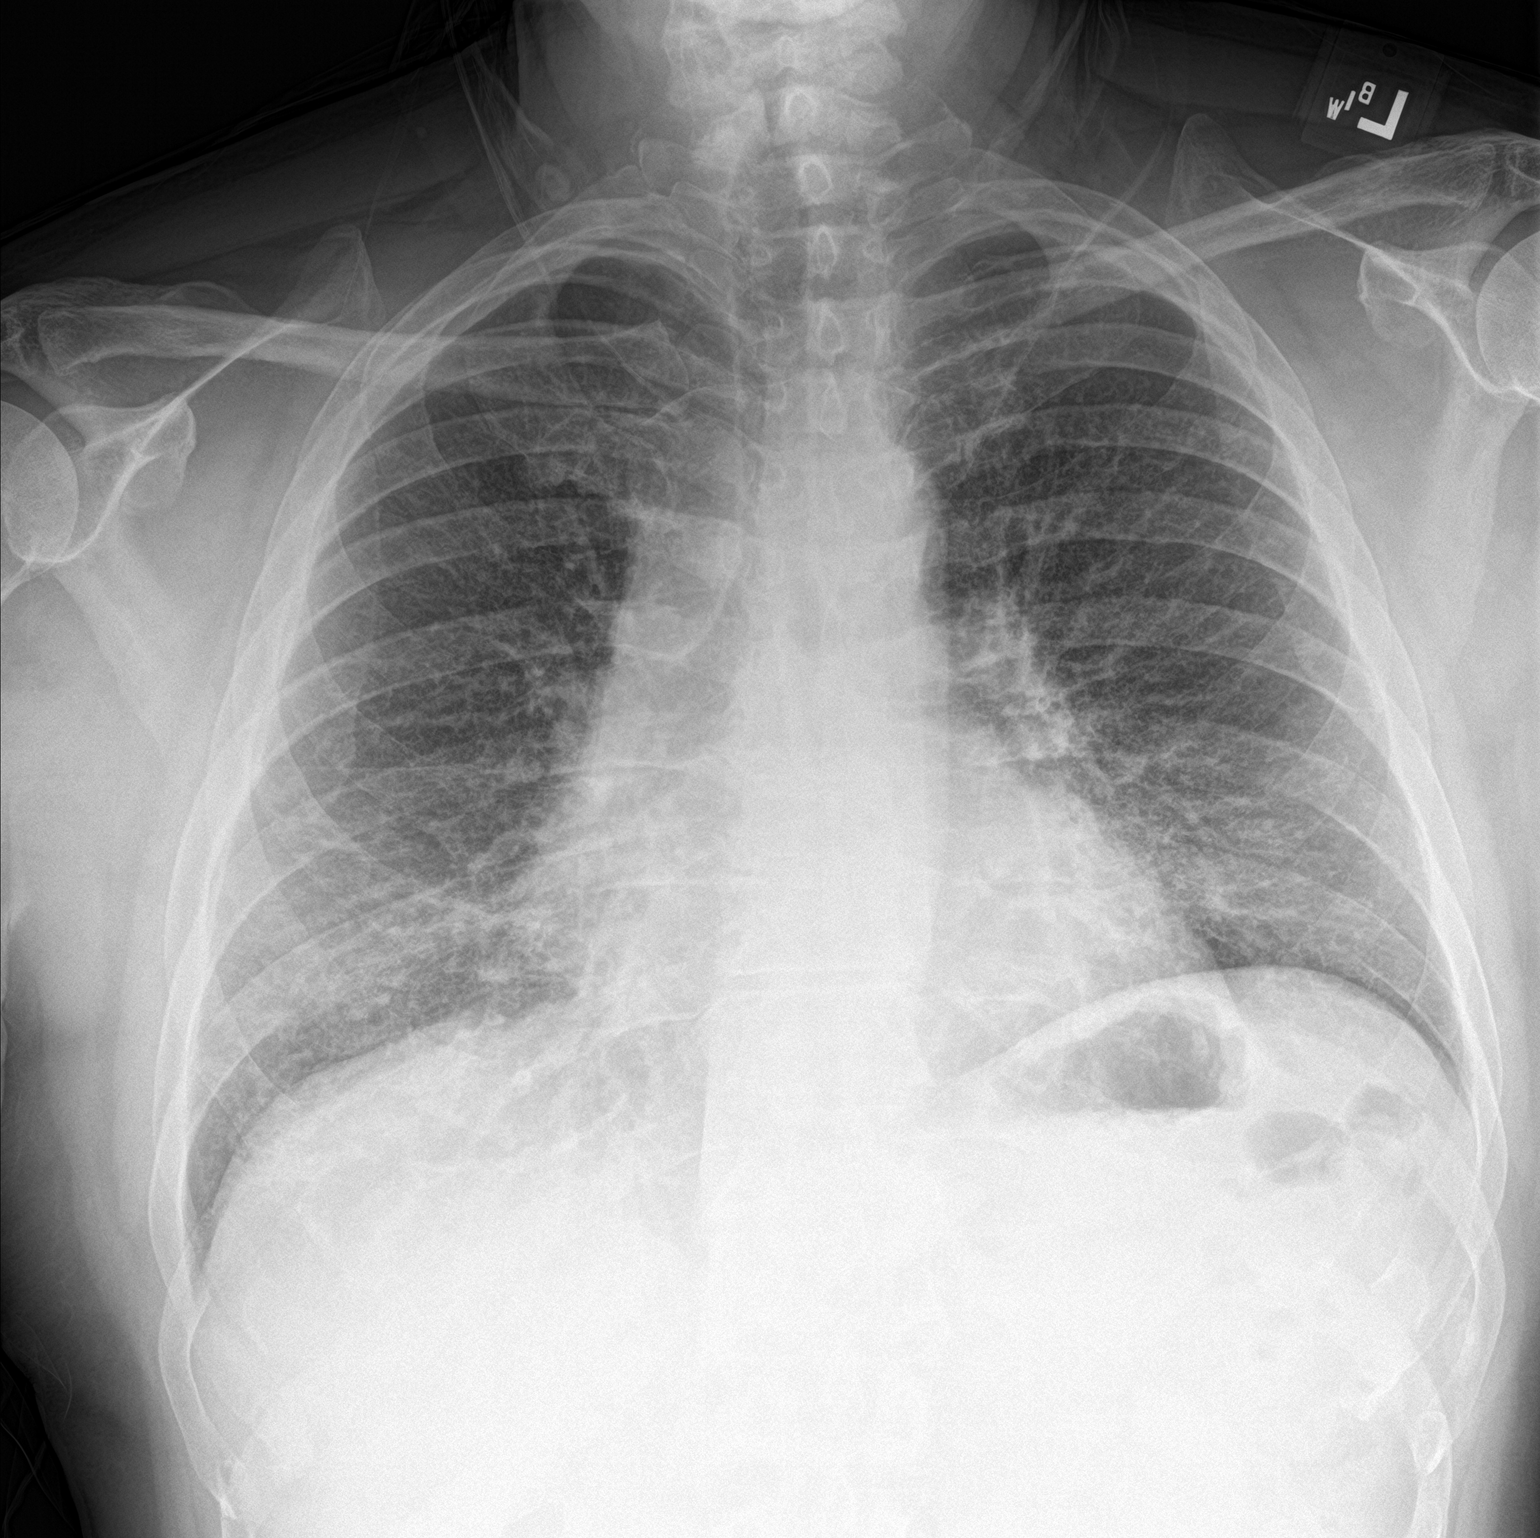

[chest lat]
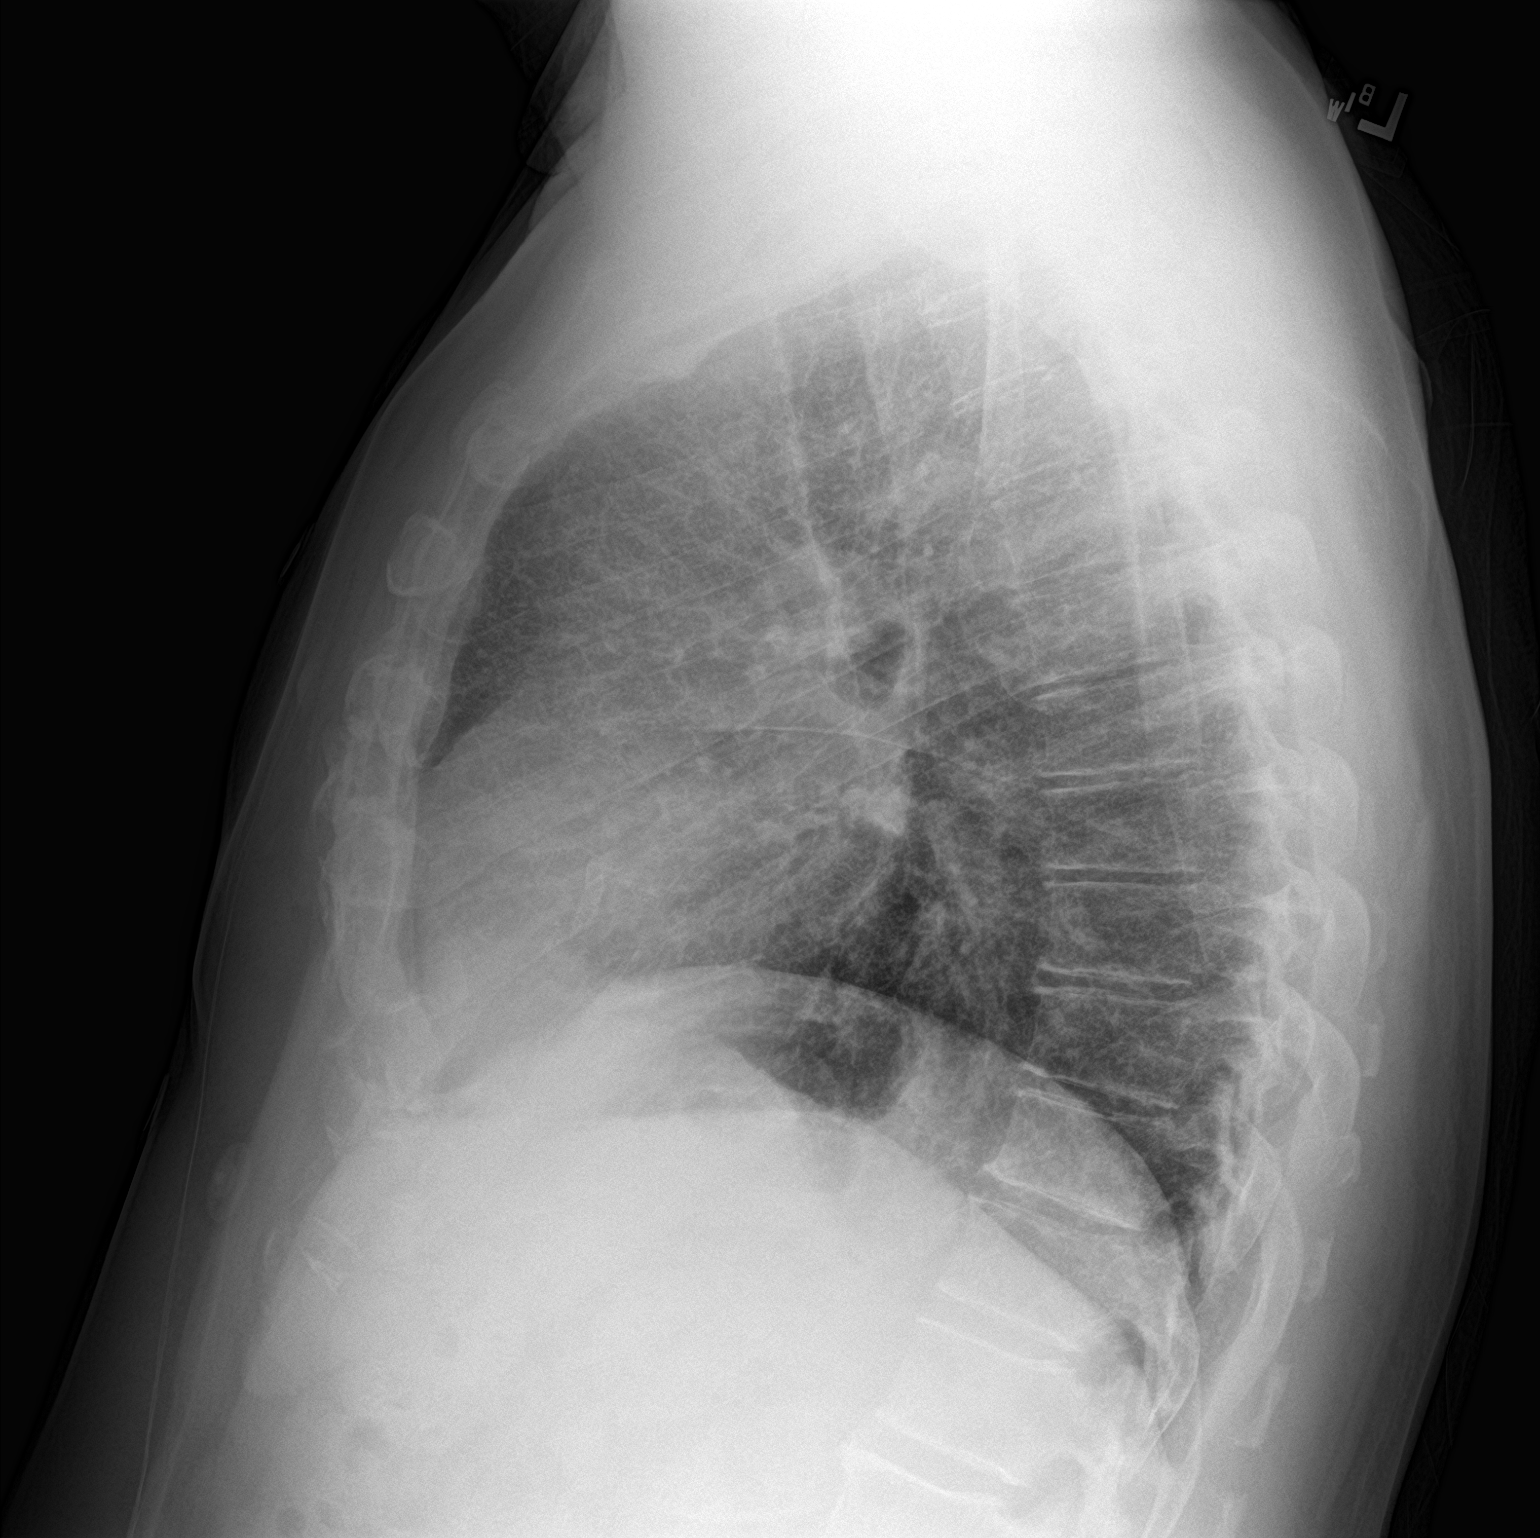

[2 of 2 positions shown; findings below may reference images not displayed]

FINDINGS: Patchy left perihilar and bibasilar opacities. Diffuse interstitial
prominence. No pneumothorax or pleural effusion. Cardiomediastinal
silhouette within normal limits. No acute osseous abnormality.
IMPRESSION: Left perihilar/bibasilar opacities and interstitial prominence.
Differential includes infection, edema or atelectasis.

## 2021-04-07 IMAGING — CT CT ANGIO AOBIFEM WO/W CM
2 of 10 series · 12 of 46 positions shown, 14 images · IV contrast (APPLIED)
Comparison: None.

CLINICAL DATA: Peripheral arterial disease, bilateral hip pain and
intermittent right lower extremity numbness

EXAM:
CT ANGIOGRAPHY OF ABDOMINAL AORTA WITH ILIOFEMORAL RUNOFF
TECHNIQUE: Multidetector CT imaging of the abdomen, pelvis and lower
extremities was performed using the standard protocol during bolus
administration of intravenous contrast. Multiplanar CT image
reconstructions and MIPs were obtained to evaluate the vascular
anatomy.
CONTRAST:  100mL OMNIPAQUE IOHEXOL 350 MG/ML SOLN

[Series 5: axial arterial upper · axial · arterial · 0.85mm/px · z∈[+232,+1564]mm · 11 of 503 slices shown, 13 images]
[im 39/503  soft-tissue]
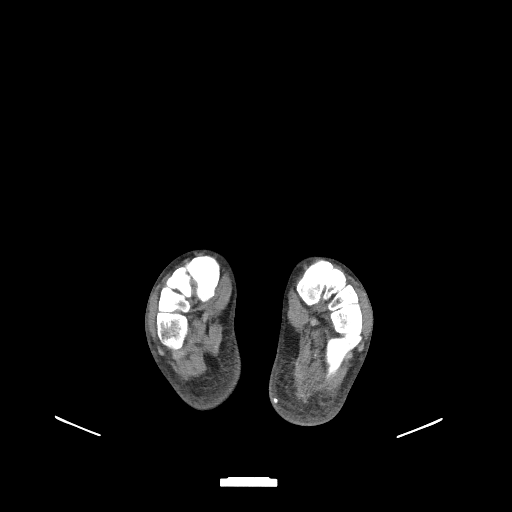
[im 39/503  bone]
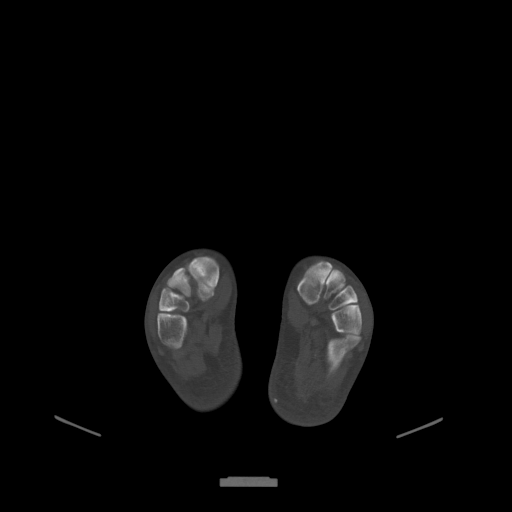
[im 97/503  soft-tissue]
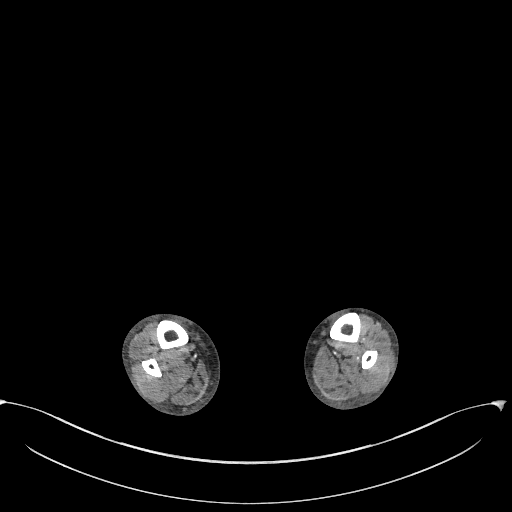
[im 155/503  soft-tissue]
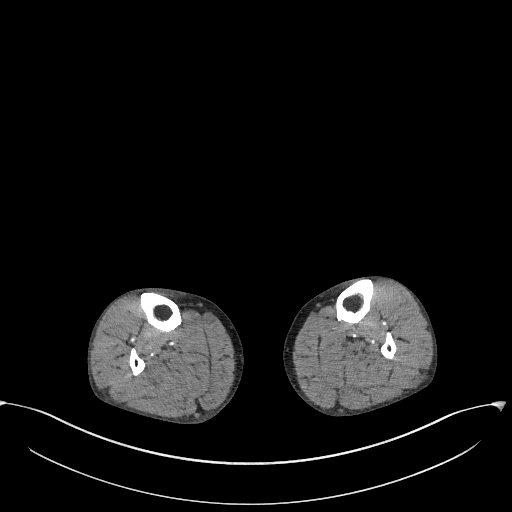
[im 213/503  soft-tissue]
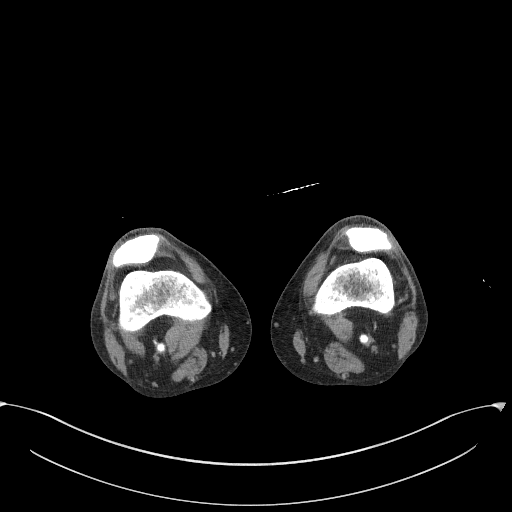
[im 290/503  soft-tissue]
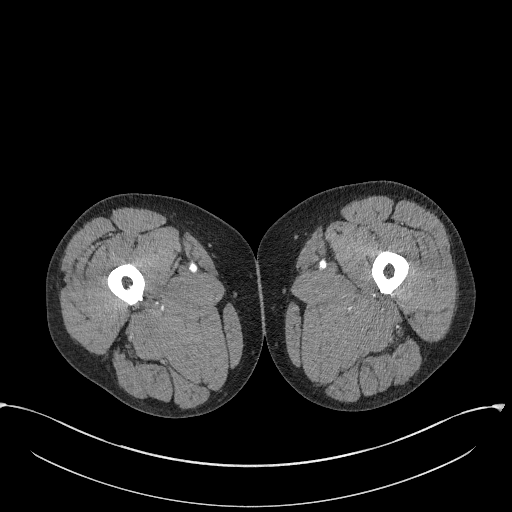
[im 348/503  soft-tissue]
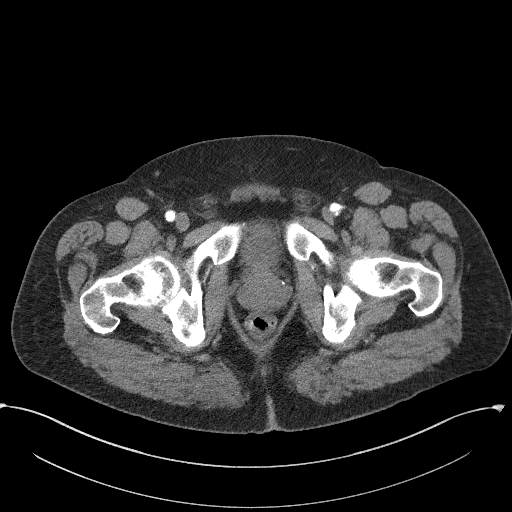
[im 406/503  soft-tissue]
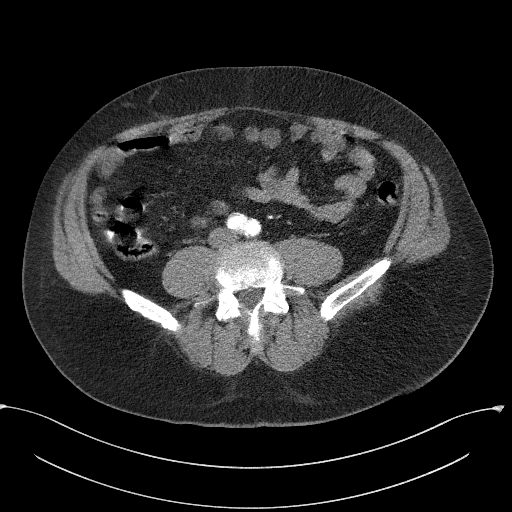
[im 425/503  lung]
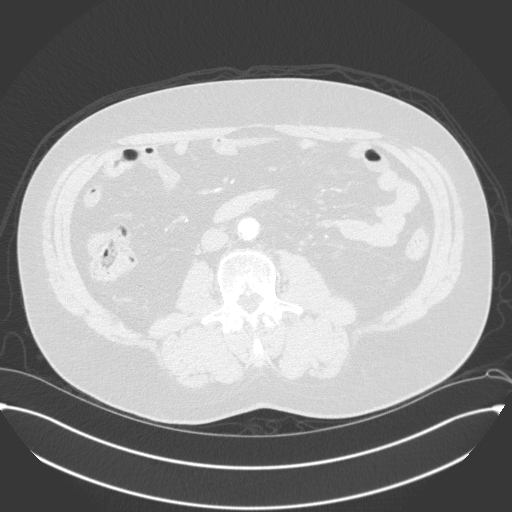
[im 445/503  lung]
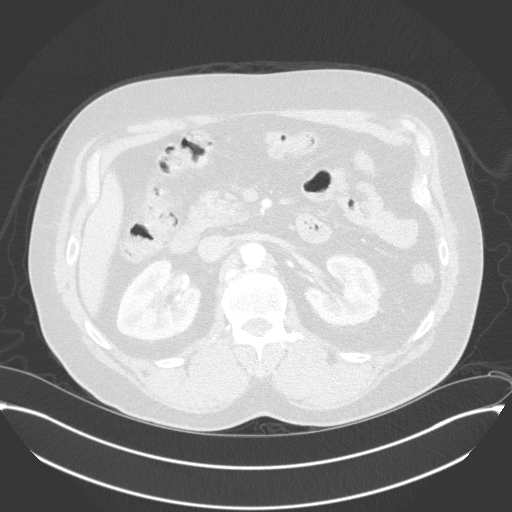
[im 464/503  soft-tissue]
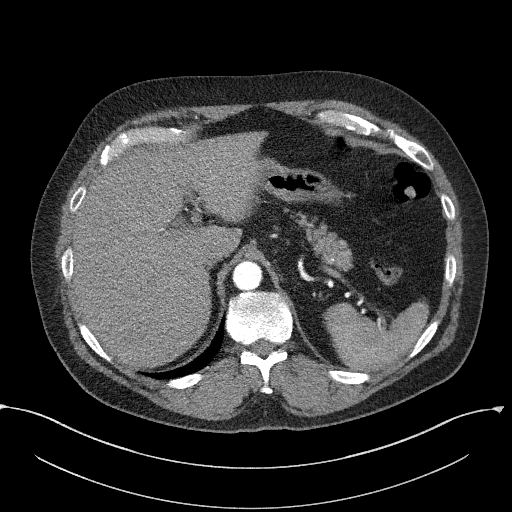
[im 464/503  lung]
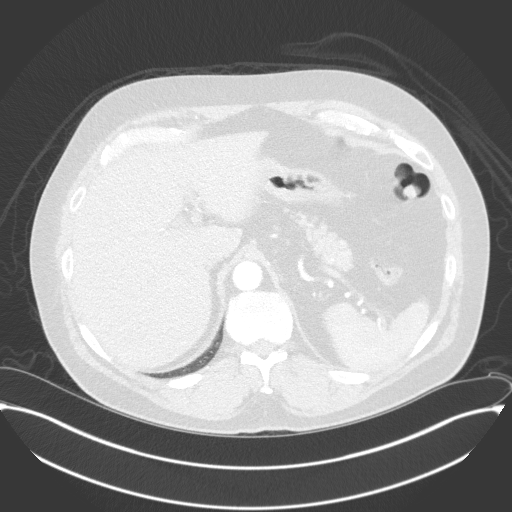
[im 483/503  lung]
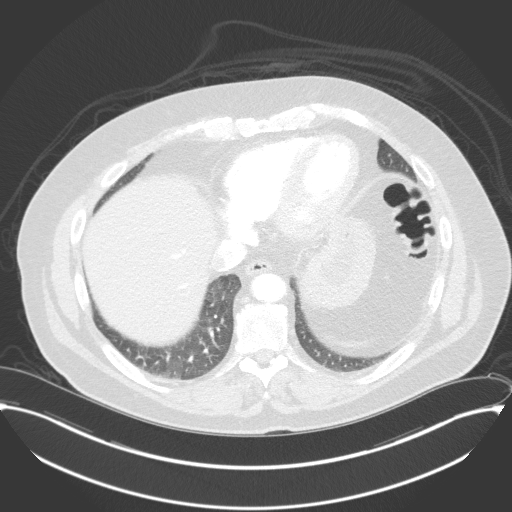

[Series 12: coronal legs · coronal · 0.82mm/px · 1 of 167 slices shown]
[im 84/167  soft-tissue]
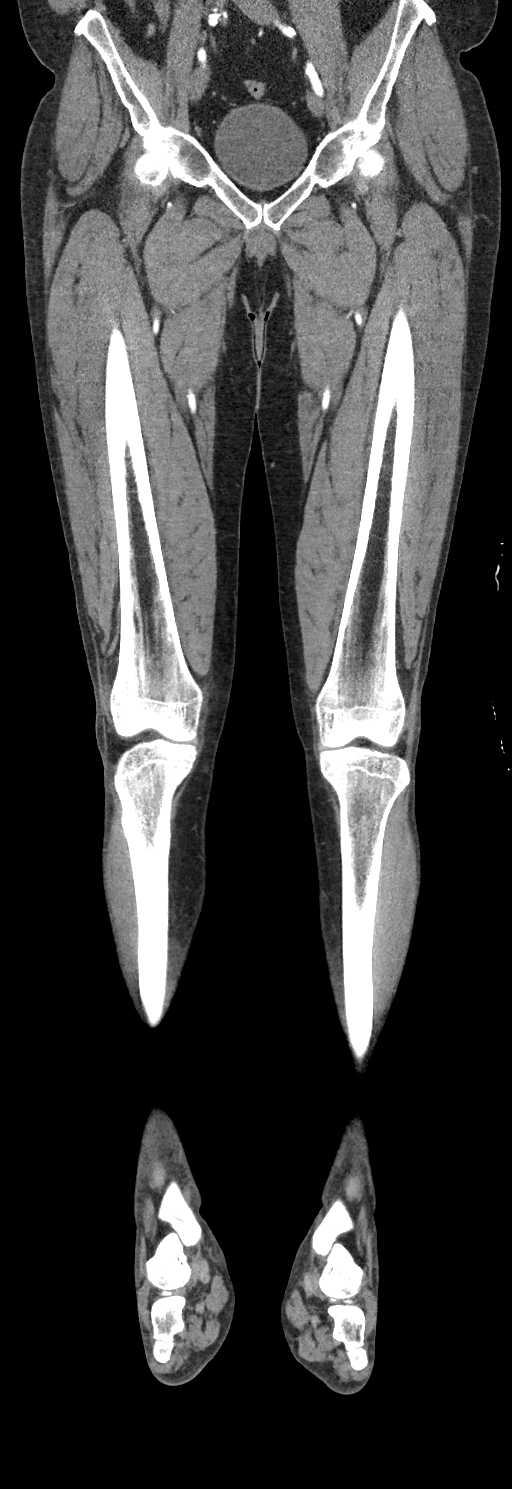

[12 of 46 positions shown; findings below may reference images not displayed]

FINDINGS: VASCULAR

Aorta: Heterogeneous irregular atherosclerotic plaque throughout the
abdominal aorta. No evidence of aneurysm or dissection.

Celiac: Patent without evidence of aneurysm, dissection, vasculitis
or significant stenosis.

SMA: Patent without evidence of aneurysm, dissection, vasculitis or
significant stenosis.

Renals: There are 2 right-sided renal arteries and 3 left-sided
renal arteries.

IMA: Patent without evidence of aneurysm, dissection, vasculitis or
significant stenosis.

RIGHT Lower Extremity

Inflow: Irregular atherosclerotic plaque without significant
stenosis in the common iliac artery. High-grade stenosis at the
origin of the internal iliac artery. The external iliac artery is
tortuous with scattered plaque but remains widely patent.

Outflow: Scattered atherosclerotic plaque in the common femoral and
superficial femoral arteries without significant stenosis. Popliteal
artery is relatively spared from disease.

Runoff: The anterior tibial and peroneal arteries appear to occlude
just above the ankle. The posterior tibial artery remains patent to
the ankle.

LEFT Lower Extremity

Inflow: Scattered atherosclerotic plaque throughout the common iliac
artery without focal significant stenosis. There is stenosis at the
origin of the internal iliac artery. The external iliac artery is
highly tortuous but patent without significant stenosis.

Outflow: Mild scattered plaque without significant stenosis in the
common femoral, superficial femoral or popliteal arteries.

Runoff: 2 vessel runoff to the ankle in the form of the posterior
tibial and peroneal arteries. The anterior tibial artery becomes
diminutive just above the ankle.

Veins: No focal venous abnormality.

Review of the MIP images confirms the above findings.

NON-VASCULAR

Lower chest: Diffuse bronchial wall thickening. Interstitial
prominence diffusely. Query expiratory phase timing. The visualized
cardiac structures are normal in size. Probable stent in the right
coronary artery. No pericardial effusion. Unremarkable distal
thoracic esophagus.

Hepatobiliary: No focal liver abnormality is seen. No gallstones,
gallbladder wall thickening, or biliary dilatation.

Pancreas: Unremarkable. No pancreatic ductal dilatation or
surrounding inflammatory changes.

Spleen: Normal in size without focal abnormality.

Adrenals/Urinary Tract: Adrenal glands are unremarkable. Kidneys are
normal, without renal calculi, focal lesion, or hydronephrosis.
Bladder is unremarkable.

Stomach/Bowel: Stomach is within normal limits. Appendix appears
normal. No evidence of bowel wall thickening, distention, or
inflammatory changes. Colonic diverticular disease without CT
evidence of active inflammation. Colonic diverticular disease
without CT evidence of active inflammation.

Lymphatic: No suspicious lymphadenopathy.

Reproductive: Prostate is unremarkable.

Other: No abdominal wall hernia or abnormality. No abdominopelvic
ascites.

Musculoskeletal: No acute fracture or aggressive appearing lytic or
blastic osseous lesion.
IMPRESSION: VASCULAR

1. Scattered heterogeneous and slightly irregular atherosclerotic
plaque without significant inflow or outflow disease.
2. Bilateral runoff disease with single-vessel runoff to the right
ankle in the form of the posterior tibial artery and 2 vessel runoff
to the left ankle via the posterior tibial and peroneal arteries.
3. Significant stenoses of the bilateral internal iliac arteries.
4.  Aortic Atherosclerosis ([id number]).

NON-VASCULAR

1. No acute abnormality within the abdomen or pelvis.
2. Bronchial wall thickening and diffuse interstitial prominence in
the visualized lower lungs. While this may reflect imaging obtained
during the expiratory phase of respiration, interstitial pulmonary
edema or chronic lung disease are difficult to exclude entirely.
3. Colonic diverticular disease without CT evidence of active
inflammation.

## 2021-10-19 ENCOUNTER — Other Ambulatory Visit (HOSPITAL_BASED_OUTPATIENT_CLINIC_OR_DEPARTMENT_OTHER): Payer: Self-pay

## 2021-10-19 ENCOUNTER — Telehealth: Payer: Self-pay | Admitting: Physician Assistant

## 2021-10-19 ENCOUNTER — Emergency Department (HOSPITAL_BASED_OUTPATIENT_CLINIC_OR_DEPARTMENT_OTHER)
Admission: EM | Admit: 2021-10-19 | Discharge: 2021-10-19 | Disposition: A | Discharge status: Home / Self Care | Attending: Emergency Medicine | Admitting: Emergency Medicine

## 2021-10-19 ENCOUNTER — Other Ambulatory Visit: Payer: Self-pay

## 2021-10-19 ENCOUNTER — Encounter (HOSPITAL_BASED_OUTPATIENT_CLINIC_OR_DEPARTMENT_OTHER): Payer: Self-pay | Admitting: Emergency Medicine

## 2021-10-19 ENCOUNTER — Emergency Department (HOSPITAL_BASED_OUTPATIENT_CLINIC_OR_DEPARTMENT_OTHER): Admitting: Radiology

## 2021-10-19 ENCOUNTER — Emergency Department (HOSPITAL_BASED_OUTPATIENT_CLINIC_OR_DEPARTMENT_OTHER)

## 2021-10-19 DIAGNOSIS — I10 Essential (primary) hypertension: Secondary | ICD-10-CM | POA: Diagnosis not present

## 2021-10-19 DIAGNOSIS — Z7982 Long term (current) use of aspirin: Secondary | ICD-10-CM | POA: Insufficient documentation

## 2021-10-19 DIAGNOSIS — Z7901 Long term (current) use of anticoagulants: Secondary | ICD-10-CM | POA: Diagnosis not present

## 2021-10-19 DIAGNOSIS — R042 Hemoptysis: Secondary | ICD-10-CM | POA: Insufficient documentation

## 2021-10-19 DIAGNOSIS — Z20822 Contact with and (suspected) exposure to covid-19: Secondary | ICD-10-CM | POA: Diagnosis not present

## 2021-10-19 DIAGNOSIS — J40 Bronchitis, not specified as acute or chronic: Secondary | ICD-10-CM | POA: Diagnosis not present

## 2021-10-19 DIAGNOSIS — J849 Interstitial pulmonary disease, unspecified: Secondary | ICD-10-CM | POA: Insufficient documentation

## 2021-10-19 DIAGNOSIS — Z79899 Other long term (current) drug therapy: Secondary | ICD-10-CM | POA: Diagnosis not present

## 2021-10-19 LAB — COMPREHENSIVE METABOLIC PANEL
ALT: 33 U/L (ref 0–44)
AST: 30 U/L (ref 15–41)
Albumin: 4.1 g/dL (ref 3.5–5.0)
Alkaline Phosphatase: 109 U/L (ref 38–126)
Anion gap: 10 (ref 5–15)
BUN: 11 mg/dL (ref 8–23)
CO2: 25 mmol/L (ref 22–32)
Calcium: 9.6 mg/dL (ref 8.9–10.3)
Chloride: 101 mmol/L (ref 98–111)
Creatinine, Ser: 0.88 mg/dL (ref 0.61–1.24)
GFR, Estimated: >60 mL/min (ref >60)
Glucose, Bld: 90 mg/dL (ref 70–99)
Potassium: 3.9 mmol/L (ref 3.5–5.1)
Sodium: 136 mmol/L (ref 135–145)
Total Bilirubin: 0.5 mg/dL (ref 0.3–1.2)
Total Protein: 7.6 g/dL (ref 6.5–8.1)

## 2021-10-19 LAB — TROPONIN I (HIGH SENSITIVITY)
Troponin I (High Sensitivity): 9 ng/L (ref <18)
Troponin I (High Sensitivity): 8 ng/L (ref <18)

## 2021-10-19 LAB — CBC
HCT: 46.9 % (ref 39.0–52.0)
Hemoglobin: 15.9 g/dL (ref 13.0–17.0)
MCH: 33.8 pg (ref 26.0–34.0)
MCHC: 33.9 g/dL (ref 30.0–36.0)
MCV: 99.8 fL (ref 80.0–100.0)
Platelets: 262 10*3/uL (ref 150–400)
RBC: 4.7 MIL/uL (ref 4.22–5.81)
RDW: 12.6 % (ref 11.5–15.5)
WBC: 9 10*3/uL (ref 4.0–10.5)
nRBC: 0 % (ref 0.0–0.2)

## 2021-10-19 LAB — PROTIME-INR
INR: 0.9 (ref 0.8–1.2)
Prothrombin Time: 12.5 seconds (ref 11.4–15.2)

## 2021-10-19 LAB — RESP PANEL BY RT-PCR (FLU A&B, COVID) ARPGX2
Influenza A by PCR: NEGATIVE
Influenza B by PCR: NEGATIVE
SARS Coronavirus 2 by RT PCR: NEGATIVE

## 2021-10-19 MED ORDER — ACETAMINOPHEN-CODEINE 300-30 MG PO TABS
1.0000 | ORAL_TABLET | Freq: Four times a day (QID) | ORAL | 0 refills | Status: DC | PRN
  Filled 2021-10-19: qty 15, 2d supply, fill #0

## 2021-10-19 MED ORDER — PREDNISONE 10 MG PO TABS: 40.0000 mg | ORAL_TABLET | Freq: Every day | ORAL | 0 refills | Status: DC

## 2021-10-19 MED ORDER — AZITHROMYCIN 250 MG PO TABS
250.0000 mg | ORAL_TABLET | Freq: Every day | ORAL | 0 refills | Status: DC
  Filled 2021-10-19: qty 6, 6d supply, fill #0

## 2021-10-19 MED ORDER — AZITHROMYCIN 250 MG PO TABS: 250.0000 mg | ORAL_TABLET | Freq: Every day | ORAL | 0 refills | Status: DC

## 2021-10-19 MED ORDER — IOHEXOL 350 MG/ML SOLN
100.0000 mL | Freq: Once | INTRAVENOUS | Status: AC | PRN
  Administered 2021-10-19: 75 mL via INTRAVENOUS

## 2021-10-19 MED ORDER — BENZONATATE 100 MG PO CAPS
100.0000 mg | ORAL_CAPSULE | Freq: Three times a day (TID) | ORAL | 0 refills | Status: DC
  Filled 2021-10-19: qty 21, 7d supply, fill #0

## 2021-10-19 MED ORDER — BENZONATATE 100 MG PO CAPS: 100.0000 mg | ORAL_CAPSULE | Freq: Three times a day (TID) | ORAL | 0 refills | Status: DC

## 2021-10-19 MED ORDER — ACETAMINOPHEN-CODEINE 300-30 MG PO TABS: 1.0000 | ORAL_TABLET | Freq: Four times a day (QID) | ORAL | 0 refills | Status: DC | PRN

## 2021-10-19 MED ORDER — PREDNISONE 10 MG PO TABS
40.0000 mg | ORAL_TABLET | Freq: Every day | ORAL | 0 refills | Status: DC
  Filled 2021-10-19: qty 20, 5d supply, fill #0

## 2021-10-19 NOTE — Discharge Instructions (Addendum)
CT scan showed evidence of chronic interstitial lung disease for which we will refer you for follow-up with the pulmonologist.  Your CT also revealed no evidence of blood clot and no evidence of pneumonia.  There were signs of acute bronchitis present with bronchial wall thickening.  Acute bronchitis is the most common cause of hemoptysis.  Return to emergency department for large-volume hemoptysis as this could be an indication for emergent fiberoptic scope of your airway. IMPRESSION:  1. No evidence of pulmonary embolism.  2. Appearance of the lungs most compatible with chronic interstitial  lung disease, similar in appearance to 2022 CT. Recommend follow-up  interstitial lung disease protocol CT of the chest on a nonemergent,  outpatient basis.  3. Mildly prominent bilateral hilar and AP window lymph nodes,  likely reactive in the setting of underlying lung disease.  4. Aortic and coronary artery atherosclerosis (ICD10-I70.0).

## 2021-10-19 NOTE — ED Triage Notes (Signed)
Pt via pov from home after an episode of coughing up blood clots this morning. His wife indicates that this happened once this weekend, and has returned today. He was worked up for similar symptoms a few years ago. Denies blood in stools, cp. He has some sob with exertion since a stroke/tia 2 years ago. Pt alert & oriented, nad noted.

## 2021-10-19 NOTE — ED Notes (Signed)
If a PT/INR is needed, RN will need to recollect

## 2021-10-19 NOTE — ED Provider Notes (Signed)
[facility] EMERGENCY DEPT Provider Note   CSN: [id number] Arrival date & time: 10/19/21  7616     History  Chief Complaint  Patient presents with   Hemoptysis    Fernando Lloyd is a 61 y.o. male.  HPI   61 year old male with medical history significant for rheumatoid arthritis, anxiety, MI status post RCA stent in 2016, HTN, HLD who presents emergency department with hemoptysis.  The patient states that he has had a nonproductive dry cough for the past week.  This morning, he developed a single episode of hemoptysis, coughing up multiple blood clots.  He endorses relatively small volume with several clots noted on tissue paper.  He denies any chest pain.  He denies any shortness of breath.  He endorses some exertional dyspnea which is chronic.  He denies any lower extremity edema or swelling.  He denies any history of PE or DVT.  He denies any weight loss, fever, chills or night sweats.  Home Medications Prior to Admission medications   Medication Sig Start Date End Date Taking? Authorizing Provider  acetaminophen-codeine (TYLENOL #3) 300-30 MG tablet Take 1-2 tablets by mouth every 6 (six) hours as needed for moderate pain. 10/19/21  Yes Ernie Avena, MD  azithromycin (ZITHROMAX) 250 MG tablet Take 1 tablet (250 mg total) by mouth daily. Take first 2 tablets together, then 1 every day until finished. 10/19/21  Yes Ernie Avena, MD  benzonatate (TESSALON) 100 MG capsule Take 1 capsule (100 mg total) by mouth every 8 (eight) hours. 10/19/21  Yes Ernie Avena, MD  predniSONE (DELTASONE) 10 MG tablet Take 4 tablets (40 mg total) by mouth daily for 5 days. 10/19/21 10/24/21 Yes Ernie Avena, MD  ALPRAZolam Prudy Feeler) 0.25 MG tablet Take 0.25 mg by mouth See admin instructions. Take 0.25 mg by mouth one to two times a day as needed for anxiety 04/30/18   [provider]  aspirin 325 MG EC tablet Take 325 mg by mouth daily.    [provider]   clopidogrel (PLAVIX) 75 MG tablet Take 1 tablet (75 mg total) by mouth daily. 11/15/18   Levert Feinstein, MD  gabapentin (NEURONTIN) 300 MG capsule Take 300 mg by mouth daily. 10/08/19   [provider]  isosorbide mononitrate (IMDUR) 60 MG 24 hr tablet Take 1 tablet (60 mg total) by mouth daily. 10/25/19 11/24/19  Harlene Salts A, PA-C  leflunomide (ARAVA) 20 MG tablet Take 20 mg by mouth daily. 08/24/21   [provider]  losartan (COZAAR) 50 MG tablet Take 1 tablet by mouth once daily Patient taking differently: Take 50 mg by mouth daily. 08/26/19   Yates Decamp, MD  metoprolol succinate (TOPROL-XL) 50 MG 24 hr tablet TAKE 1 TABLET BY MOUTH ONCE DAILY WITH A MEAL OR  IMMEDIATELY  FOLLOWING  A  MEAL 10/28/19   Yates Decamp, MD  nitroGLYCERIN (NITROSTAT) 0.4 MG SL tablet Place 1 tablet (0.4 mg total) under the tongue every 5 (five) minutes as needed for up to 25 days for chest pain. 06/14/18 10/25/19  Yates Decamp, MD  pantoprazole (PROTONIX) 20 MG tablet Take 1 tablet (20 mg total) by mouth daily. 10/25/19   Harlene Salts A, PA-C  RINVOQ 15 MG TB24 Take 15 mg by mouth daily.  10/18/19   [provider]  rosuvastatin (CRESTOR) 40 MG tablet Take 40 mg by mouth at bedtime.     [provider]  sertraline (ZOLOFT) 50 MG tablet Take 50 mg by mouth daily. 09/18/21  [provider]  triamcinolone cream (KENALOG) 0.1 % Apply topically 2 (two) times daily. 09/27/21   [provider]      Allergies    Patient has no known allergies.    Review of Systems   Review of Systems  All other systems reviewed and are negative.   Physical Exam Updated Vital Signs BP 102/81   Pulse (!) 58   Temp 97.8 F (36.6 C) (Oral)   Resp 17   Ht 6\' 1"  (1.854 m)   Wt 106.6 kg   SpO2 94%   BMI 31.00 kg/m  Physical Exam Vitals and nursing note reviewed.  Constitutional:      General: He is not in acute distress.    Appearance: He is well-developed.  HENT:      Head: Normocephalic and atraumatic.  Eyes:     Conjunctiva/sclera: Conjunctivae normal.  Cardiovascular:     Rate and Rhythm: Normal rate and regular rhythm.  Pulmonary:     Effort: Pulmonary effort is normal. No respiratory distress.     Breath sounds: Normal breath sounds.  Abdominal:     Palpations: Abdomen is soft.     Tenderness: There is no abdominal tenderness.  Musculoskeletal:        General: No swelling.     Cervical back: Neck supple.     Right lower leg: No edema.     Left lower leg: No edema.  Skin:    General: Skin is warm and dry.     Capillary Refill: Capillary refill takes less than 2 seconds.  Neurological:     Mental Status: He is alert.  Psychiatric:        Mood and Affect: Mood normal.     ED Results / Procedures / Treatments   Labs (all labs ordered are listed, but only abnormal results are displayed) Labs Reviewed  RESP PANEL BY RT-PCR (FLU A&B, COVID) [id number]  COMPREHENSIVE METABOLIC PANEL  CBC  PROTIME-INR  TROPONIN I (HIGH SENSITIVITY)  TROPONIN I (HIGH SENSITIVITY)    EKG EKG Interpretation  Date/Time:  Tuesday October 19 2021 10:56:56 EDT Ventricular Rate:  60 PR Interval:  181 QRS Duration: 88 QT Interval:  438 QTC Calculation: 438 R Axis:   -32 Text Interpretation: Sinus rhythm Left axis deviation Abnormal R-wave progression, early transition Confirmed by Regan Lemming ([id number]) on 10/19/2021 11:17:11 AM  Radiology CT Angio Chest PE W and/or Wo Contrast  Result Date: 10/19/2021 CLINICAL DATA:  Hemoptysis. Pulmonary embolism (PE) suspected, high prob EXAM: CT ANGIOGRAPHY CHEST WITH CONTRAST TECHNIQUE: Multidetector CT imaging of the chest was performed using the standard protocol during bolus administration of intravenous contrast. Multiplanar CT image reconstructions and MIPs were obtained to evaluate the vascular anatomy. RADIATION DOSE REDUCTION: This exam was performed according to the departmental dose-optimization program which  includes automated exposure control, adjustment of the mA and/or kV according to patient size and/or use of iterative reconstruction technique. CONTRAST:  7mL OMNIPAQUE IOHEXOL 350 MG/ML SOLN COMPARISON:  Chest x-ray 10/19/2021.  Abdominal CT 01/08/2020 FINDINGS: Cardiovascular: Satisfactory opacification of the pulmonary arteries to the segmental level. No evidence of pulmonary embolism. Thoracic aorta is normal in caliber. Scattered atherosclerotic vascular calcifications of the aorta and coronary arteries. Normal heart size. No pericardial effusion. Mediastinum/Nodes: Mildly prominent bilateral hilar lymph nodes measuring 1.3 cm on the right and 1.0 cm on the left (series 4, images 62-63). Mildly prominent 1.0 cm AP window node (series 4, image 58). No axillary lymphadenopathy. Thyroid, trachea, and esophagus  within normal limits. Lungs/Pleura: Diffuse bilateral interstitial thickening with areas of subpleural reticulation. There is a degree of mosaic attenuation within the mid to lower lung fields. Diffuse bronchial wall thickening. Appearance is similar to the previous CT from 2022. No pleural effusion or pneumothorax. Upper Abdomen: No acute abnormality. Musculoskeletal: No chest wall abnormality. No acute or significant osseous findings. Review of the MIP images confirms the above findings. IMPRESSION: 1. No evidence of pulmonary embolism. 2. Appearance of the lungs most compatible with chronic interstitial lung disease, similar in appearance to 2022 CT. Recommend follow-up interstitial lung disease protocol CT of the chest on a nonemergent, outpatient basis. 3. Mildly prominent bilateral hilar and AP window lymph nodes, likely reactive in the setting of underlying lung disease. 4. Aortic and coronary artery atherosclerosis (ICD10-I70.0). Electronically Signed   By: Duanne Guess D.O.   On: 10/19/2021 11:55   DG Chest 2 View  Result Date: 10/19/2021 CLINICAL DATA:  Hemoptysis EXAM: CHEST - 2 VIEW  COMPARISON:  10/25/2019 FINDINGS: The heart size and mediastinal contours are within normal limits. Bibasilar interstitial opacities, more pronounced on the right. No pleural effusion or pneumothorax. The visualized skeletal structures are unremarkable. IMPRESSION: Bibasilar interstitial opacities, more pronounced on the right, which may reflect atelectasis versus infection. Electronically Signed   By: Duanne Guess D.O.   On: 10/19/2021 09:48    Procedures Procedures    Medications Ordered in ED Medications  iohexol (OMNIPAQUE) 350 MG/ML injection 100 mL (75 mLs Intravenous Contrast Given 10/19/21 1131)    ED Course/ Medical Decision Making/ A&P                           Medical Decision Making Amount and/or Complexity of Data Reviewed Labs: ordered. Radiology: ordered.  Risk Prescription drug management.     61 year old male with medical history significant for rheumatoid arthritis, anxiety, MI status post RCA stent in 2016, HTN, HLD who presents emergency department with hemoptysis.  The patient states that he has had a nonproductive dry cough for the past week.  This morning, he developed a single episode of hemoptysis, coughing up multiple blood clots.  He endorses relatively small volume with several clots noted on tissue paper.  He denies any chest pain.  He denies any shortness of breath.  He endorses some exertional dyspnea which is chronic.  He denies any lower extremity edema or swelling.  He denies any history of PE or DVT.  He denies any weight loss, fever, chills or night sweats.  On arrival, the patient was vitally stable, afebrile, not tachycardic or tachypneic, hypertensive BP 182/90, saturating 97% on room air.  Physical exam significant for lungs that were clear to auscultation bilaterally, no murmurs rubs or gallops with normal heart sounds present, no lower extremity edema.  An EKG was performed and revealed sinus rhythm, ventricular rate 6 0, no evidence of  significant ST segment changes or T wave inversions.  Chest x-ray was performed which revealed by basilar interstitial opacities, more pronounced on the right which may represent atelectasis versus infection.  After evaluation significant for COVID and influenza negative, CMP unremarkable, CBC without a leukocytosis or anemia, troponin negative, coags normal.  CTA PE study was performed to evaluate for lung mass in the setting of hemoptysis, PE in the setting of hemoptysis.  CT revealed evidence of chronic interstitial lung disease with evidence of acute bronchitis with bronchial wall thickening.  The patient has not seen a pulmonologist  for this.  IMPRESSION:  1. No evidence of pulmonary embolism.  2. Appearance of the lungs most compatible with chronic interstitial  lung disease, similar in appearance to 2022 CT. Recommend follow-up  interstitial lung disease protocol CT of the chest on a nonemergent,  outpatient basis.  3. Mildly prominent bilateral hilar and AP window lymph nodes,  likely reactive in the setting of underlying lung disease.  4. Aortic and coronary artery atherosclerosis (ICD10-I70.0).   I spoke with on-call .  Pulmonary critical care who will message the [facility] to get the patient scheduled for follow-up.  Referral was placed.  Patient is not hypoxic, not tachycardic, overall well-appearing, no further episodes of hemoptysis which was relatively small volume.  Do not feel that the patient warrants inpatient hospital admission at this time.  We will treat for acute bronchitis with a steroid burst, Tylenol with codeine, Tessalon Perles.  Given the patient's findings of interstitial lung disease, will cover also with azithromycin for atypical pathogens.  Stable for discharge with return precautions provided in the event of worsening episodes of hemoptysis,, clinical worsening of shortness of breath, cough, development of fever or chills.  DC Instructions: CT scan  showed evidence of chronic interstitial lung disease for which we will refer you for follow-up with the pulmonologist.  Your CT also revealed no evidence of blood clot and no evidence of pneumonia.  There were signs of acute bronchitis present with bronchial wall thickening.  Acute bronchitis is the most common cause of hemoptysis.  Return to emergency department for large-volume hemoptysis as this could be an indication for emergent fiberoptic scope of your airway.   Final Clinical Impression(s) / ED Diagnoses Final diagnoses:  Bronchitis  Hemoptysis  Interstitial lung disease (HCC)    Rx / DC Orders ED Discharge Orders          Ordered    Ambulatory referral to Pulmonology        10/19/21 1218    acetaminophen-codeine (TYLENOL #3) 300-30 MG tablet  Every 6 hours PRN        10/19/21 1223    benzonatate (TESSALON) 100 MG capsule  Every 8 hours        10/19/21 1223    azithromycin (ZITHROMAX) 250 MG tablet  Daily        10/19/21 1223    predniSONE (DELTASONE) 10 MG tablet  Daily        10/19/21 1223              Ernie Avena, MD 10/19/21 1229

## 2021-10-22 ENCOUNTER — Encounter: Payer: Self-pay | Admitting: Internal Medicine

## 2021-10-22 ENCOUNTER — Other Ambulatory Visit: Payer: Self-pay | Admitting: Internal Medicine

## 2021-10-22 ENCOUNTER — Ambulatory Visit: Admitting: Internal Medicine

## 2021-10-22 DIAGNOSIS — M0510 Rheumatoid lung disease with rheumatoid arthritis of unspecified site: Secondary | ICD-10-CM

## 2021-10-22 DIAGNOSIS — F17210 Nicotine dependence, cigarettes, uncomplicated: Secondary | ICD-10-CM

## 2021-10-22 MED ORDER — FAMOTIDINE 20 MG PO TABS: ORAL_TABLET | ORAL | 11 refills | Status: DC

## 2021-10-22 MED ORDER — PANTOPRAZOLE SODIUM 40 MG PO TBEC: 40.0000 mg | DELAYED_RELEASE_TABLET | Freq: Every day | ORAL | 2 refills | Status: DC

## 2021-10-22 MED ORDER — PREDNISONE 10 MG PO TABS: ORAL_TABLET | ORAL | 0 refills | Status: DC

## 2021-10-22 NOTE — Patient Instructions (Addendum)
Prednisone 10 mg daily for now  - when 100% better cut in half   Pantoprazole (protonix) 40 mg   Take  30-60 min before first meal of the day and Pepcid (famotidine)  20 mg after supper until return to office - this is the best way to tell whether stomach acid is contributing to your problem.    GERD (REFLUX)  is an extremely common cause of respiratory symptoms just like yours , many times with no obvious heartburn at all.    It can be treated with medication, but also with lifestyle changes including elevation of the head of your bed (ideally with 6 -8inch blocks under the headboard of your bed),  Smoking cessation, avoidance of late meals, excessive alcohol, and avoid fatty foods, chocolate, peppermint, colas, red wine, and acidic juices such as orange juice.  NO MINT OR MENTHOL PRODUCTS SO NO COUGH DROPS  USE SUGARLESS CANDY INSTEAD (Jolley ranchers or Stover's or Life Savers) or even ice chips will also do - the key is to swallow to prevent all throat clearing. NO OIL BASED VITAMINS - use powdered substitutes.  Avoid fish oil when coughing.   For cough > Mucinex dm 1200 mg every 12 hours as needed   The key is to stop smoking completely before smoking completely stops you!  Please schedule a follow up office visit in 4 weeks, sooner if needed

## 2021-10-22 NOTE — Assessment & Plan Note (Signed)
Counseled re importance of smoking cessation but did not meet time criteria for separate billing    Each maintenance medication was reviewed in detail including emphasizing most importantly the difference between maintenance and prns and under what circumstances the prns are to be triggered using an action plan format where appropriate.  Total time for H and P, chart review, counseling,  directly observing portions of ambulatory 02 saturation study/ and generating customized AVS unique to this office visit / same day charting > 45 min new pt eval

## 2021-10-22 NOTE — Assessment & Plan Note (Addendum)
Onset ? 2022 CT chest > no change Chest CTa 10/19/21 / active smoker - 10/22/2021   Walked on RA  x  3  lap(s) =  Approx 750  ft  @ mod pace, stopped due to end of study  with lowest 02 sats 91 min sob  DDx for pulmonary fibrosis  includes idiopathic pulmonary fibrosis, pulmonary fibrosis associated with rheumatologic diseases (which have a relatively benign course in most cases) , adverse effect from  drugs such as chemotherapy or amiodarone exposure, nonspecific interstitial pneumonia which is typically steroid responsive, and chronic hypersensitivity pneumonitis.   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 or unrelated EG DP or RBILD  but no evidence of IPF or lung ca at this point so hemoptysis should clear with rx for bronchitis> complete Zpak and regroup in 4 weeks to set up long term rx   Already on prednisone with poor control of RA symptoms (which typically correlated well with deterioration in RA lung dz and may be that nausea he's experiencing is related to self titrating prednisone 3 x weekly so instead rec 10 mg daily until symptoms improve than hold at 5 mg daily (don't stop until re-reval in 4 weeks, consider higher doses if not better in a week)   Also  rx mucinex dm prn and added gerd rx for cough and because Use of PPI is associated with improved survival time and with decreased radiologic fibrosis per King's study published in AJRCCM vol 184 p1390.  Dec 2011 and also may have other beneficial effects as per the latest review in [place] vol 193 Z6109 Jun 20016.

## 2021-10-22 NOTE — Progress Notes (Signed)
Fernando Lloyd, male    DOB: May 10, 1960   MRN: [id number]   Brief patient profile:  66  yowm  with RA on Xelgance  referred to pulmonary clinic 10/22/2021 by EDP  for  hemoptysis in setting of chronic cough  x 2018 with intermittent bloody mucus and CT pos for ILD   Rheumatology :  Fernando Lloyd   History of Present Illness  10/22/2021  Pulmonary/ 1st office eval/Fernando Lloyd  Chief Complaint  Patient presents with   Pulmonary Consult    Referred by Dr. Ernie Lloyd. Went to ED on 10/19/21 with hemoptysis- coughing up dark red clot of blood. He states he had PNA after having Covid 21 April 2021. He gets winded taking out the trash. Does okay walking flat surface. He has had some vomiting off and on past few days. He also c/o poor appetite.   Dyspnea:  MMRC2 = can't walk a nl pace on a flat grade s sob but does fine slow and flat  Cough: bloody since 10/13/21 and really 10/17 > ER.   Worse in am  Sleep: difficult due to RA x one year  SABA use: none - had hfa and neb  Vax x 3 and infected 3-4 x  Prednisone 5 mg avg 3 per week  No obvious day to day or daytime pattern/variability or assoc excess/ purulent sputum or mucus plugs or  cp or chest tightness, subjective wheeze or overt sinus or hb symptoms.    Also denies any obvious fluctuation of symptoms with weather or environmental changes or other aggravating or alleviating factors except as outlined above   No unusual exposure hx or h/o childhood pna/ asthma or knowledge of premature birth.  Current Allergies, Complete Past Medical History, Past Surgical History, Family History, and Social History were reviewed in [employer] record.  ROS  The following are not active complaints unless bolded Hoarseness, sore throat, dysphagia, dental problems, itching, sneezing,  nasal congestion or discharge of excess mucus or purulent secretions, ear ache,   fever, chills, sweats, unintended wt loss or wt gain, classically  pleuritic or exertional cp,  orthopnea pnd or arm/hand swelling  or leg swelling, presyncope, palpitations, abdominal pain, anorexia, nausea, vomiting on zpak diarrhea  or change in bowel habits or change in bladder habits, change in stools or change in urine, dysuria, hematuria,  rash, arthralgias, visual complaints, headache, numbness, weakness or ataxia or problems with walking or coordination,  change in mood or  memory.            Past Medical History:  Diagnosis Date   Anxiety    Ataxia    Hyperlipidemia    Hypertension    Myocardial infarct (HCC) 11/23/2014   RCA stent 3.5x23 DES in [place]   Rheumatoid arthritis (HCC)     Outpatient Medications Prior to Visit- - NOTE:   Unable to verify as accurately reflecting what pt takes    Medication Sig Dispense Refill   acetaminophen-codeine (TYLENOL #3) 300-30 MG tablet Take 1-2 tablets by mouth every 6 (six) hours as needed for moderate pain. 15 tablet 0   ALPRAZolam (XANAX) 0.25 MG tablet Take 0.25 mg by mouth See admin instructions. Take 0.25 mg by mouth one to two times a day as needed for anxiety         0   benzonatate (TESSALON) 100 MG capsule Take 1 capsule (100 mg total) by mouth every 8 (eight) hours. 21 capsule 0   isosorbide mononitrate (IMDUR) 60  MG 24 hr tablet Take 1 tablet (60 mg total) by mouth daily. 30 tablet 0   leflunomide (ARAVA) 20 MG tablet Take 20 mg by mouth daily.     losartan (COZAAR) 50 MG tablet Take 1 tablet by mouth once daily (Patient taking differently: Take 50 mg by mouth daily.) 90 tablet 0   pantoprazole (PROTONIX) 20 MG tablet Take 1 tablet (20 mg total) by mouth daily. 30 tablet 0   predniSONE (DELTASONE) 10 MG tablet Take 4 tablets (40 mg total) by mouth daily for 5 days. 20 tablet 0   sertraline (ZOLOFT) 50 MG tablet Take 50 mg by mouth daily.     triamcinolone cream (KENALOG) 0.1 % Apply topically 2 (two) times daily.     aspirin 325 MG EC tablet Take 325 mg by mouth daily.     clopidogrel (PLAVIX) 75  MG tablet Take 1 tablet (75 mg total) by mouth daily. 30 tablet 11   gabapentin (NEURONTIN) 300 MG capsule Take 300 mg by mouth daily.     metoprolol succinate (TOPROL-XL) 50 MG 24 hr tablet TAKE 1 TABLET BY MOUTH ONCE DAILY WITH A MEAL OR  IMMEDIATELY  FOLLOWING  A  MEAL 90 tablet 0   nitroGLYCERIN (NITROSTAT) 0.4 MG SL tablet Place 1 tablet (0.4 mg total) under the tongue every 5 (five) minutes as needed for up to 25 days for chest pain. 25 tablet 3   RINVOQ 15 MG TB24 Take 15 mg by mouth daily.      rosuvastatin (CRESTOR) 40 MG tablet Take 40 mg by mouth at bedtime.      No facility-administered medications prior to visit.     Objective:     BP 132/86 (BP Location: Left Arm, Cuff Size: Normal)   Pulse 81   Temp 98.7 F (37.1 C) (Oral)   Ht 6\' 1"  (1.854 m)   Wt 230 lb 9.6 oz (104.6 kg)   SpO2 93% Comment: on RA  BMI 30.42 kg/m   SpO2: 93 % (on RA)  Amb somber wm nad   HEENT : Oropharynx  clear/ top denture/ lower partial    Nasal turbinates nl   NECK :  without  apparent JVD/ palpable Nodes/TM    LUNGS: no acc muscle use,  Min barrel  contour chest wall with bilateral  slightly decreased bs min insp/exp rhonchi  and  without cough on insp or exp maneuvers and min  Hyperresonant  to  percussion bilaterally    CV:  RRR  no s3 or murmur or increase in P2, and no edema   ABD:  soft and nontender with pos end  insp Hoover's  in the supine position.  No bruits or organomegaly appreciated   MS:  Nl gait/ ext warm without deformities Or obvious joint restrictions  calf tenderness, cyanosis or clubbing     SKIN: warm and dry without lesions    NEURO:  alert, approp, nl sensorium with  no motor or cerebellar deficits apparent.         I personally reviewed images and agree with radiology impression as follows:   Chest CTa 10/19/21  1. No evidence of pulmonary embolism. 2. Appearance of the lungs most compatible with chronic interstitial lung disease, similar in appearance  to 01/2020 CT. Recommend follow-up interstitial lung disease protocol CT of the chest on a nonemergent, outpatient basis. 3. Mildly prominent bilateral hilar and AP window lymph nodes, likely reactive in the setting of underlying lung disease. 4. Aortic and  coronary artery atherosclerosis (ICD10-I70.0).      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 2022 CT chest > no change Chest CTa 10/19/21 / active smoker - 10/22/2021   Walked on RA  x  3  lap(s) =  Approx 750  ft  @ mod pace, stopped due to end of study  with lowest 02 sats 91 min sob  DDx for pulmonary fibrosis  includes idiopathic pulmonary fibrosis, pulmonary fibrosis associated with rheumatologic diseases (which have a relatively benign course in most cases) , adverse effect from  drugs such as chemotherapy or amiodarone exposure, nonspecific interstitial pneumonia which is typically steroid responsive, and chronic hypersensitivity pneumonitis.   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 or unrelated EG DP or RBILD  but no evidence of IPF or lung ca at this point so hemoptysis should clear with rx for bronchitis> complete Zpak and regroup in 4 weeks to set up long term rx   Already on prednisone with poor control of RA symptoms (which typically correlated well with deterioration in RA lung dz and may be that nausea he's experiencing is related to self titrating prednisone 3 x weekly so instead rec 10 mg daily until symptoms improve than hold at 5 mg daily (don't stop until re-reval in 4 weeks, consider higher doses if not better in a week)   Also  rx mucinex dm prn and added gerd rx for cough and because Use of PPI is associated with improved survival time and with decreased radiologic fibrosis per King's study published in AJRCCM vol 184 p1390.  Dec 2011 and  also may have other beneficial effects as per the latest review in [place] vol 193 P5093 Jun 20016.           Cigarette smoker Counseled re importance of smoking cessation but did not meet time criteria for separate billing    Each maintenance medication was reviewed in detail including emphasizing most importantly the difference between maintenance and prns and under what circumstances the prns are to be triggered using an action plan format where appropriate.  Total time for H and P, chart review, counseling,  directly observing portions of ambulatory 02 saturation study/ and generating customized AVS unique to this office visit / same day charting > 45 min new pt eval                    Christinia Gully, MD 10/22/2021

## 2021-11-06 ENCOUNTER — Other Ambulatory Visit: Payer: Self-pay | Admitting: Internal Medicine

## 2021-11-19 ENCOUNTER — Ambulatory Visit (INDEPENDENT_AMBULATORY_CARE_PROVIDER_SITE_OTHER): Admitting: Internal Medicine

## 2021-11-19 ENCOUNTER — Encounter: Payer: Self-pay | Admitting: Internal Medicine

## 2021-11-19 ENCOUNTER — Telehealth: Payer: Self-pay | Admitting: Internal Medicine

## 2021-11-19 VITALS — BP 128/80 | HR 91 | Temp 98.7°F | Ht 73.0 in | Wt 232.6 lb

## 2021-11-19 DIAGNOSIS — M0510 Rheumatoid lung disease with rheumatoid arthritis of unspecified site: Secondary | ICD-10-CM

## 2021-11-19 DIAGNOSIS — J4489 Other specified chronic obstructive pulmonary disease: Secondary | ICD-10-CM | POA: Diagnosis not present

## 2021-11-19 DIAGNOSIS — F17210 Nicotine dependence, cigarettes, uncomplicated: Secondary | ICD-10-CM

## 2021-11-19 MED ORDER — ALBUTEROL SULFATE HFA 108 (90 BASE) MCG/ACT IN AERS: INHALATION_SPRAY | RESPIRATORY_TRACT | Status: DC

## 2021-11-19 MED ORDER — ALBUTEROL SULFATE (2.5 MG/3ML) 0.083% IN NEBU: 2.5000 mg | INHALATION_SOLUTION | RESPIRATORY_TRACT | 12 refills | Status: AC | PRN

## 2021-11-19 MED ORDER — BENZONATATE 200 MG PO CAPS: 200.0000 mg | ORAL_CAPSULE | Freq: Three times a day (TID) | ORAL | 11 refills | Status: DC | PRN

## 2021-11-19 MED ORDER — BREZTRI AEROSPHERE 160-9-4.8 MCG/ACT IN AERO: 2.0000 | INHALATION_SPRAY | Freq: Two times a day (BID) | RESPIRATORY_TRACT | 0 refills | Status: DC

## 2021-11-19 MED ORDER — FAMOTIDINE 20 MG PO TABS: ORAL_TABLET | ORAL | 11 refills | Status: DC

## 2021-11-19 NOTE — Telephone Encounter (Signed)
Need to schedule pfts next available

## 2021-11-19 NOTE — Assessment & Plan Note (Addendum)
Active smoker - 11/19/2021  After extensive coaching inhaler device,  effectiveness =    90% with breztri but prominent cough so rec rx symbicort 80 2bid trial  with prn saba and f/u with full pfts   Still has upper airway component ? Gerd related but PPI likely causing diarrhea and "stomach rumbling" so try on just pepcid 20 mg bid for now and continue diet   Ok to control cough with tessalon 200 as he has found this effective in past

## 2021-11-19 NOTE — Assessment & Plan Note (Signed)
Counseled re importance of smoking cessation but did not meet time criteria for separate billing    Each maintenance medication was reviewed in detail including emphasizing most importantly the difference between maintenance and prns and under what circumstances the prns are to be triggered using an action plan format where appropriate.  Total time for H and P, chart review, counseling, reviewing hfa/neb device(s) , directly observing portions of ambulatory 02 saturation study/ and generating customized AVS unique to this office visit / same day charting = 30 min               

## 2021-11-19 NOTE — Telephone Encounter (Signed)
Attempted to call pt but unable to reach. Left message for him to return call. °

## 2021-11-19 NOTE — Progress Notes (Signed)
Fernando Lloyd, male    DOB: September 15, 1960   MRN: [id number]   Brief patient profile:  18  yowm  active smoker  with RA previously on Xelgan   referred to pulmonary clinic 10/22/2021 by EDP  for  hemoptysis in setting of chronic cough  x 2018 with intermittent bloody mucus and CT pos for ILD   Rheumatology :  Fernando Lloyd   History of Present Illness  10/22/2021  Pulmonary/ 1st office eval/Fernando Lloyd  Chief Complaint  Patient presents with   Pulmonary Consult    Referred by Dr. Ernie Lloyd. Went to ED on 10/19/21 with hemoptysis- coughing up dark red clot of blood. He states he had PNA after having Covid 21 April 2021. He gets winded taking out the trash. Does okay walking flat surface. He has had some vomiting off and on past few days. He also c/o poor appetite.   Dyspnea:  MMRC2 = can't walk a nl pace on a flat grade s sob but does fine slow and flat  Cough: bloody since 10/13/21 and really 10/17 > ER.   Worse in am  Sleep: difficult due to RA x one year  SABA use: none - had hfa and neb  Vax x 3 and infected 3-4 x  Prednisone 5 mg avg 3 per week Rec Prednisone 10 mg daily for now  - when 100% better cut in half  Pantoprazole (protonix) 40 mg   Take  30-60 min before first meal of the day and Pepcid (famotidine)  20 mg after supper until return to office  GERD diet reviewed, bed blocks rec  For cough > Mucinex dm 1200 mg every 12 hours as needed  The key is to stop smoking completely before smoking completely stops you!    11/19/2021  f/u ov/Fernando Lloyd re: cough x 2018 maint on MTX and folic acid since 11/15/21 on prednisone 10 mg  Chief Complaint  Patient presents with   Follow-up    Having diarrhea for days at a time.  ? If medications are causing this.  Dyspnea:  slower pace = MMRC2 = can't walk a nl pace on a flat grade s sob but does fine slow and flat  [place] [employer] [place] / slowed by back pain  Cough: tessalon worked the best/ tsp at a time> pale yellow no longer bloody   Sleeping: sitting upright position x years  SABA use: could not tell hfa helped/  neb helped but no meds  02: none  Mucinex dm did not help      No obvious day to day or daytime variability or assoc excess/ purulent sputum or mucus plugs or hemoptysis or cp or chest tightness, subjective wheeze or overt sinus or hb symptoms.   Sleeping  without nocturnal  or early am exacerbation  of respiratory  c/o's or need for noct saba. Also denies any obvious fluctuation of symptoms with weather or environmental changes or other aggravating or alleviating factors except as outlined above   No unusual exposure hx or h/o childhood pna/ asthma or knowledge of premature birth.  Current Allergies, Complete Past Medical History, Past Surgical History, Family History, and Social History were reviewed in [employer] record.  ROS  The following are not active complaints unless bolded Hoarseness, sore throat, dysphagia, dental problems, itching, sneezing,  nasal congestion or discharge of excess mucus or purulent secretions, ear ache,   fever, chills, sweats, unintended wt loss or wt gain, classically pleuritic or exertional  cp,  orthopnea pnd or arm/hand swelling  or leg swelling, presyncope, palpitations, abdominal pain, anorexia, nausea, vomiting, diarrhea  or change in bowel habits or change in bladder habits, change in stools or change in urine, dysuria, hematuria,  rash, arthralgias, visual complaints, headache, numbness, weakness or ataxia or problems with walking or coordination,  change in mood or  memory.        Current Meds  Medication Sig   ALPRAZolam (XANAX) 0.25 MG tablet Take 0.25 mg by mouth See admin instructions. Take 0.25 mg by mouth one to two times a day as needed for anxiety   famotidine (PEPCID) 20 MG tablet One after supper   folic acid (FOLVITE) 1 MG tablet 1 tablet Orally Once a day for 60 days   leflunomide (ARAVA) 20 MG tablet Take 20 mg by mouth daily.    losartan (COZAAR) 50 MG tablet Take 1 tablet by mouth once daily (Patient taking differently: Take 50 mg by mouth daily.)   methotrexate (RHEUMATREX) 2.5 MG tablet Take 2.5 mg by mouth once a week.   pantoprazole (PROTONIX) 40 MG tablet Take 1 tablet (40 mg total) by mouth daily. Take 30-60 min before first meal of the day   predniSONE (DELTASONE) 10 MG tablet TAKE 1 TAB DAILY UNTIL FEELING BETTER THEN TAKE 1/2 TAB DAILY THEREAFTER AS DIRECTED   sertraline (ZOLOFT) 50 MG tablet Take 50 mg by mouth daily.   triamcinolone cream (KENALOG) 0.1 % Apply topically 2 (two) times daily.              Past Medical History:  Diagnosis Date   Anxiety    Ataxia    Hyperlipidemia    Hypertension    Myocardial infarct (HCC) 11/23/2014   RCA stent 3.5x23 DES in [place]   Rheumatoid arthritis (HCC)        Objective:   Wts   11/19/2021     232  10/22/21 230 lb 9.6 oz (104.6 kg)  10/19/21 235 lb (106.6 kg)  01/09/20 244 lb (110.7 kg)      Vital signs reviewed  11/19/2021  - Note at rest 02 sats  95% on RA   General appearance:    amb wm mild pseudowheeze     HEENT : Oropharynx  clear    NECK :  without  apparent JVD/ palpable Nodes/TM    LUNGS: no acc muscle use,  Min barrel  contour chest wall with insp/exp rhonchi  and  without cough on insp or exp maneuvers and min  Hyperresonant  to  percussion bilaterally    CV:  RRR  no s3 or murmur or increase in P2, and 1+ pitting R ? Trace L pitting ("years"per pt)   ABD: obese soft and nontender with pos end  insp Hoover's  in the supine position.  No bruits or organomegaly appreciated   MS:  Nl gait/ ext warm without deformities Or obvious joint restrictions  calf tenderness, cyanosis or clubbing     SKIN: warm and dry without lesions    NEURO:  alert, approp, nl sensorium with  no motor or cerebellar deficits apparent.          I personally reviewed images and agree with radiology impression as follows:   Chest CTa 10/19/21  1. No  evidence of pulmonary embolism. 2. Appearance of the lungs most compatible with chronic interstitial lung disease, similar in appearance to 2022 CT.   3. Mildly prominent bilateral hilar and AP window lymph nodes, likely reactive  in the setting of underlying lung disease.      Assessment

## 2021-11-19 NOTE — Assessment & Plan Note (Addendum)
Onset ? 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

## 2021-11-19 NOTE — Patient Instructions (Addendum)
For cough tessalon 200 mg up to every 6 hours as needed  GERD (REFLUX)  is an extremely common cause of respiratory symptoms just like yours , many times with no obvious heartburn at all.    It can be treated with medication, but also with lifestyle changes including elevation of the head of your bed (ideally with 6 -8inch blocks under the headboard of your bed),  Smoking cessation, avoidance of late meals, excessive alcohol, and avoid fatty foods, chocolate, peppermint, colas, red wine, and acidic juices such as orange juice.  NO MINT OR MENTHOL PRODUCTS SO NO COUGH DROPS  USE SUGARLESS CANDY INSTEAD (Jolley ranchers or Stover's or Life Savers) or even ice chips will also do - the key is to swallow to prevent all throat clearing. NO OIL BASED VITAMINS - use powdered substitutes.  Avoid fish oil when coughing.   Stop pantaprazole and take pepcid 20 mg twice daily in am with your meals and  about 12 hours   Plan A = Automatic = Always=    symbicort 80 (or Breztri ) Take 2 puffs first thing in am and then another 2 puffs about 12 hours later.    Work on inhaler technique:  relax and gently blow all the way out then take a nice smooth full deep breath back in, triggering the inhaler at same time you start breathing in.  Hold breath in for at least  5 seconds if you can. Blow out symbicort  thru nose. Rinse and gargle with water when done.  If mouth or throat bother you at all,  try brushing teeth/gums/tongue with arm and hammer toothpaste/ make a slurry and gargle and spit out.      Plan B = Backup (to supplement plan A, not to replace it) Only use your albuterol inhaler as a rescue medication to be used if you can't catch your breath by resting or doing a relaxed purse lip breathing pattern.  - The less you use it, the better it will work when you need it. - Ok to use the inhaler up to 2 puffs  every 4 hours if you must but call for appointment if use goes up over your usual need - Don't leave home  without it !!  (think of it like the spare tire for your car)   Plan C = Crisis (instead of Plan B but only if Plan B stops working) - only use your albuterol nebulizer if you first try Plan B and it fails to help > ok to use the nebulizer up to every 4 hours but if start needing it regularly call for immediate appointment   My office will be contacting you by phone for referral for High resolution CT chest   - if you don't hear back from my office within one week please call us back or notify us thru MyChart and we'll address it right away.   The key is to stop smoking completely before smoking completely stops you!   Make sure you check your oxygen saturation at your highest level of activity to be sure it stays over 90% and keep track of it at least once a week, more often if breathing getting worse, and let me know if losing ground.

## 2021-11-27 ENCOUNTER — Other Ambulatory Visit: Payer: Self-pay | Admitting: Internal Medicine

## 2021-12-14 ENCOUNTER — Ambulatory Visit (HOSPITAL_BASED_OUTPATIENT_CLINIC_OR_DEPARTMENT_OTHER)
Admission: RE | Admit: 2021-12-14 | Discharge: 2021-12-14 | Disposition: A | Discharge status: Home / Self Care | Source: Ambulatory Visit | Attending: Internal Medicine | Admitting: Internal Medicine

## 2021-12-14 DIAGNOSIS — M0510 Rheumatoid lung disease with rheumatoid arthritis of unspecified site: Secondary | ICD-10-CM | POA: Diagnosis not present

## 2021-12-16 NOTE — Progress Notes (Signed)
Called the pt and there was no answer- LMTCB    

## 2021-12-17 NOTE — Telephone Encounter (Signed)
Called and spoke with patient.  Dr. Thurston Hole results and recommendations given to patient and wife.  Understanding stated.  Patient did not have PFT ordered or scheduled per previous note.  PFT ordered and patient is scheduled 01/17/22 at 1100 for full PFT.  Patient requested sooner OV to discuss CT results in more detail.  Patient's wife had questions about results given and reading HRCT impression on my chart. Patient is scheduled 12/24/21 at 2pm for follow up to discuss HRCT results with Dr. Sherene Sires.        Nyoka Cowden, MD 12/16/2021 12:41 PM EST     Call patient :  Study is c/w rheumatoid lung dz as expected and key is to control the systemic dz and the lung should do fine.  Be sure patient has/keeps f/u ov so we can go over all the details of this study and get a plan together moving forward - ok to move up f/u if not feeling better and wants to be seen sooner

## 2021-12-17 NOTE — Telephone Encounter (Signed)
Pt called twice ret Leslie's call. Adv would have to leave message. Pls CB @ [phone]

## 2021-12-24 ENCOUNTER — Ambulatory Visit (INDEPENDENT_AMBULATORY_CARE_PROVIDER_SITE_OTHER): Admitting: Internal Medicine

## 2021-12-24 ENCOUNTER — Encounter: Payer: Self-pay | Admitting: Internal Medicine

## 2021-12-24 VITALS — BP 130/82 | HR 72 | Temp 98.6°F | Ht 73.0 in | Wt 226.0 lb

## 2021-12-24 DIAGNOSIS — F17210 Nicotine dependence, cigarettes, uncomplicated: Secondary | ICD-10-CM

## 2021-12-24 DIAGNOSIS — M0510 Rheumatoid lung disease with rheumatoid arthritis of unspecified site: Secondary | ICD-10-CM | POA: Diagnosis not present

## 2021-12-24 DIAGNOSIS — J4489 Other specified chronic obstructive pulmonary disease: Secondary | ICD-10-CM | POA: Diagnosis not present

## 2021-12-24 MED ORDER — ALBUTEROL SULFATE HFA 108 (90 BASE) MCG/ACT IN AERS: 2.0000 | INHALATION_SPRAY | RESPIRATORY_TRACT | 2 refills | Status: DC | PRN

## 2021-12-24 MED ORDER — BREZTRI AEROSPHERE 160-9-4.8 MCG/ACT IN AERO: 2.0000 | INHALATION_SPRAY | Freq: Two times a day (BID) | RESPIRATORY_TRACT | 11 refills | Status: AC

## 2021-12-24 MED ORDER — BREZTRI AEROSPHERE 160-9-4.8 MCG/ACT IN AERO: 2.0000 | INHALATION_SPRAY | Freq: Two times a day (BID) | RESPIRATORY_TRACT | 0 refills | Status: DC

## 2021-12-24 NOTE — Assessment & Plan Note (Addendum)
Onset ? 2022 CT chest > no change Chest CTa 10/19/21 / active smoker - 10/22/2021   Walked on RA  x  3  lap(s) =  Approx 750  ft  @ mod pace, stopped due to end of study  with lowest 02 sats 91% with  min sob   - 11/19/2021   Walked on RA  x  3  lap(s) =  approx 750  ft  @ nl pace, stopped due to end of study  with lowest 02 sats 93% and hip pain> sob on 3rd lap  - HRCT chest  12./12/23 Mild pulmonary fibrosis in a pattern with apical to basal gradient, featuring irregular peripheral and peribronchovascular interstitial opacity, septal thickening, and ground-glass, without evidence of traction bronchiectasis, subpleural bronchiolectasis, or honeycombing. Findings are suggestive of an alternative diagnosis (not UIP) per consensus guidelines, leading differential consideration fibrotic phase NSIP and generally in keeping with reported diagnosis of rheumatoid arthritis and possible connective tissue disorder related interstitial lung disease >> referred to PF clinic at wife's request   Advised that parenchymal lung involvement in RA lung dz tends to parallel the systemic control of the dz so needs to keep up with Azucena Fallen closely and return for pfts as planned   In meantime advised: Make sure you check your oxygen saturation at your highest level of activity to be sure it stays over 90% and keep track of it at least once a week, more often if breathing getting worse, and let me know if losing ground. (Collect the dots to connect the dots approach)

## 2021-12-24 NOTE — Assessment & Plan Note (Addendum)
Active smoker - 11/19/2021  After extensive coaching inhaler device,  effectiveness =    90% with breztri but prominent cough so rec rx symbicort 80 2bid trial and f/u with full pfts  - 12/24/2021  After extensive coaching inhaler device,  effectiveness =  90% with HFA and no cough with use so try again for breztri 2 bid and then back to the symbicort 80 2bid if worse coughing develps   Return as planned for full pfts          Each maintenance medication was reviewed in detail including emphasizing most importantly the difference between maintenance and prns and under what circumstances the prns are to be triggered using an action plan format where appropriate.  Total time for H and P, chart review, counseling,  teaching optimal HFA and generating customized AVS unique to this office visit / same day charting > 30 min for multiple  refractory respiratory  symptoms of uncertain etiology

## 2021-12-24 NOTE — Patient Instructions (Signed)
I will be referring you to the pulmonary fibrosis clinic  Plan A = Automatic = Always=    Breztri Take 2 puffs first thing in am and then another 2 puffs about 12 hours later.     Plan B = Backup (to supplement plan A, not to replace it) Only use your albuterol inhaler as a rescue medication to be used if you can't catch your breath by resting or doing a relaxed purse lip breathing pattern.  - The less you use it, the better it will work when you need it. - Ok to use the inhaler up to 2 puffs  every 4 hours if you must but call for appointment if use goes up over your usual need - Don't leave home without it !!  (think of it like the spare tire for your car)   Plan C = Crisis (instead of Plan B but only if Plan B stops working) - only use your albuterol nebulizer if you first try Plan B and it fails to help > ok to use the nebulizer up to every 4 hours but if start needing it regularly call for immediate appointment   The key is to stop smoking completely before smoking completely stops you!

## 2021-12-24 NOTE — Progress Notes (Signed)
Fernando Lloyd, male    DOB: Dec 22, 1960   MRN: [id number]   Brief patient profile:  43  yowm  active smoker  with RA previously on Xelgan   referred to pulmonary clinic 10/22/2021 by EDP  for  hemoptysis in setting of chronic cough  x 2018 with intermittent bloody mucus and CT pos for ILD   Rheumatology :  Fernando Lloyd   History of Present Illness  10/22/2021  Pulmonary/ 1st office eval/Fernando Lloyd  Chief Complaint  Patient presents with   Pulmonary Consult    Referred by Dr. Ernie Lloyd. Went to ED on 10/19/21 with hemoptysis- coughing up dark red clot of blood. He states he had PNA after having Covid 21 April 2021. He gets winded taking out the trash. Does okay walking flat surface. He has had some vomiting off and on past few days. He also c/o poor appetite.   Dyspnea:  MMRC2 = can't walk a nl pace on a flat grade s sob but does fine slow and flat  Cough: bloody since 10/13/21 and really 10/17 > ER.   Worse in am  Sleep: difficult due to RA x one year  SABA use: none - had hfa and neb  Vax x 3 and infected 3-4 x  Prednisone 5 mg avg 3 per week Rec Prednisone 10 mg daily for now  - when 100% better cut in half  Pantoprazole (protonix) 40 mg   Take  30-60 min before first meal of the day and Pepcid (famotidine)  20 mg after supper until return to office  GERD diet reviewed, bed blocks rec  For cough > Mucinex dm 1200 mg every 12 hours as needed  The key is to stop smoking completely before smoking completely stops you!    11/19/2021  f/u ov/Fernando Lloyd re: cough x 2018 maint on MTX and folic acid since 11/15/21 on prednisone 10 mg  Chief Complaint  Patient presents with   Follow-up    Having diarrhea for days at a time.  ? If medications are causing this.  Dyspnea:  slower pace = MMRC2 = can't walk a nl pace on a flat grade s sob but does fine slow and flat  [place] [employer] [place] / slowed by back pain  Cough: tessalon worked the best/ tsp at a time> pale yellow no longer bloody   Sleeping: sitting upright position x years  SABA use: could not tell hfa helped/  neb helped but no meds  02: none  Mucinex dm did not help  Rec For cough tessalon 200 mg up to every 6 hours as needed GERD diet reviewed, bed blocks rec  Stop pantaprazole and take pepcid 20 mg twice daily in am with your meals and  about 12 hours  Plan A = Automatic = Always=   Symbicort 80 (or Breztri ) Take 2 puffs first thing in am and then another 2 puffs about 12 hours later. Work on Printmaker B = Backup (to supplement plan A, not to replace it) Only use your albuterol inhaler as a rescue medication Plan C = Crisis (instead of Plan B but only if Plan B stops working) - only use your albuterol nebulizer if you first try Plan B   the key is to stop smoking completely before smoking completely stops you! 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  Pt feels he is sleeping too much.  Mouth dryness.  Gets dry cough  Dyspnea:  limited by back and leg pain/ not checking sats  Cough: pos am congestion / white  Sleeping: on couch on side new better SABA use: rare neb  02: none  Covid status:   vax x 3/ infected at least once     No obvious day to day or daytime variability or assoc  purulent sputum or mucus plugs or hemoptysis or cp or chest tightness, subjective wheeze or overt sinus or hb symptoms.    Also denies any obvious fluctuation of symptoms with weather or environmental changes or other aggravating or alleviating factors except as outlined above   No unusual exposure hx or h/o childhood pna/ asthma or knowledge of premature birth.  Current Allergies, Complete Past Medical History, Past Surgical History, Family History, and  Social History were reviewed in [employer] record.  ROS  The following are not active complaints unless bolded Hoarseness, sore throat, dysphagia, dental problems, itching, sneezing,  nasal congestion or discharge of excess mucus or purulent secretions, ear ache,   fever, chills, sweats, unintended wt loss or wt gain, classically pleuritic or exertional cp,  orthopnea pnd or arm/hand swelling  or leg swelling, presyncope, palpitations, abdominal pain, anorexia, nausea, vomiting, diarrhea  or change in bowel habits or change in bladder habits, change in stools or change in urine, dysuria, hematuria,  rash, arthralgias, visual complaints, headache, numbness, weakness or ataxia or problems with walking or coordination,  change in mood or  memory.        Current Meds  Medication Sig   albuterol (PROAIR HFA) 108 (90 Base) MCG/ACT inhaler 2 puffs up to every 4 hours as needed only  if your can't catch your breath   albuterol (PROVENTIL) (2.5 MG/3ML) 0.083% nebulizer solution Take 3 mLs (2.5 mg total) by nebulization every 4 (four) hours as needed.   ALPRAZolam (XANAX) 0.25 MG tablet Take 0.25 mg by mouth See admin instructions. Take 0.25 mg by mouth one to two times a day as needed for anxiety   benzonatate (TESSALON) 200 MG capsule Take 1 capsule (200 mg total) by mouth 3 (three) times daily as needed for cough.   famotidine (PEPCID) 20 MG tablet One twice daily   folic acid (FOLVITE) 1 MG tablet 1 tablet Orally Once a day for 60 days   leflunomide (ARAVA) 20 MG tablet Take 20 mg by mouth daily.   losartan (COZAAR) 50 MG tablet Take 1 tablet by mouth once daily (Patient taking differently: Take 50 mg by mouth daily.)   methotrexate (RHEUMATREX) 2.5 MG tablet Take 2.5 mg by mouth once a week.   predniSONE (DELTASONE) 10 MG tablet TAKE 1 TAB DAILY UNTIL FEELING BETTER THEN TAKE 1/2 TAB DAILY THEREAFTER AS DIRECTED   sertraline (ZOLOFT) 50 MG tablet Take 50 mg by mouth daily.    triamcinolone cream (KENALOG) 0.1 % Apply topically 2 (two) times daily.            Past Medical History:  Diagnosis Date   Anxiety    Ataxia    Hyperlipidemia    Hypertension    Myocardial infarct ([facility]) 11/23/2014   RCA stent 3.5x23 DES in [place]   Rheumatoid arthritis ([facility])  Objective:   Wts  12/24/2021     226  11/19/2021     232  10/22/21 230 lb 9.6 oz (104.6 kg)  10/19/21 235 lb (106.6 kg)  01/09/20 244 lb (110.7 kg)     Vital signs reviewed  12/24/2021  - Note at rest 02 sats  97% on RA   General appearance:    somber amb wm nad   HEENT : Oropharynx  clear    NECK :  without  apparent JVD/ palpable Nodes/TM    LUNGS: no acc muscle use,  Min barrel  contour chest wall with bilateral  slightly decreased bs s audible wheeze and  without cough on insp or exp maneuvers and min  Hyperresonant  to  percussion bilaterally    CV:  RRR  no s3 or murmur or increase in P2, and no edema   ABD:  soft and nontender   MS:  Nl gait/ ext warm without deformities Or obvious joint restrictions  calf tenderness, cyanosis or clubbing     SKIN: warm and dry without lesions    NEURO:  alert, approp, nl sensorium with  no motor or cerebellar deficits apparent.               Assessment

## 2021-12-24 NOTE — Assessment & Plan Note (Signed)
4-5 min discussion re active cigarette smoking in addition to office E&M  Ask about tobacco use:   ongoing  Advise quitting   I took an extended  opportunity with this patient to outline the consequences of continued cigarette use  in airway disorders based on all the data we have from the multiple national lung health studies (perfomed over decades at millions of dollars in cost)  indicating that smoking cessation, not choice of inhalers or physicians, is the most important aspect of his care.   Assess willingness:  Not committed at this point Assist in quit attempt:   Suggested e-cigs as an optional  "one way bridge"  Off all tobacco products  Arrange follow up:   Follow up per Primary Care planned

## 2022-01-07 ENCOUNTER — Other Ambulatory Visit: Payer: Self-pay | Admitting: Internal Medicine

## 2022-01-14 ENCOUNTER — Ambulatory Visit: Admitting: Internal Medicine

## 2022-01-17 ENCOUNTER — Encounter

## 2022-01-23 NOTE — Progress Notes (Deleted)
Fernando Lloyd, male    DOB: Feb 01, 1960   MRN: [id number]   Brief patient profile:  23  yowm active smoker  with RA previously on Xelgan   referred to pulmonary clinic 10/22/2021 by EDP  for  hemoptysis in setting of chronic cough  x 2018 with intermittent bloody mucus and CT pos for ILD   Rheumatology :  Leafy Kindle   History of Present Illness  10/22/2021  Pulmonary/ 1st office eval/Fernando Lloyd  Chief Complaint  Patient presents with   Pulmonary Consult    Referred by Dr. Regan Lemming. Went to ED on 10/19/21 with hemoptysis- coughing up dark red clot of blood. He states he had PNA after having Covid 21 April 2021. He gets winded taking out the trash. Does okay walking flat surface. He has had some vomiting off and on past few days. He also c/o poor appetite.   Dyspnea:  MMRC2 = can't walk a nl pace on a flat grade s sob but does fine slow and flat  Cough: bloody since 10/13/21 and really 10/17 > ER.   Worse in am  Sleep: difficult due to RA x one year  SABA use: none - had hfa and neb  Vax x 3 and infected 3-4 x  Prednisone 5 mg avg 3 per week Rec Prednisone 10 mg daily for now  - when 100% better cut in half  Pantoprazole (protonix) 40 mg   Take  30-60 min before first meal of the day and Pepcid (famotidine)  20 mg after supper until return to office  GERD diet reviewed, bed blocks rec  For cough > Mucinex dm 1200 mg every 12 hours as needed  The key is to stop smoking completely before smoking completely stops you!    11/19/2021  f/u ov/Fernando Lloyd re: cough x 2018 maint on MTX and folic acid since [id number] on prednisone 10 mg  Chief Complaint  Patient presents with   Follow-up    Having diarrhea for days at a time.  ? If medications are causing this.  Dyspnea:  slower pace = MMRC2 = can't walk a nl pace on a flat grade s sob but does fine slow and flat  [place] [employer] [place] / slowed by back pain  Cough: tessalon worked the best/ tsp at a time> pale yellow no longer bloody   Sleeping: sitting upright position x years  SABA use: could not tell hfa helped/  neb helped but no meds  02: none  Mucinex dm did not help  Rec For cough tessalon 200 mg up to every 6 hours as needed GERD diet reviewed, bed blocks rec  Stop pantaprazole and take pepcid 20 mg twice daily in am with your meals and  about 12 hours  Plan A = Automatic = Always=   Symbicort 80 (or Breztri ) Take 2 puffs first thing in am and then another 2 puffs about 12 hours later. Work on Orthoptist B = Backup (to supplement plan A, not to replace it) Only use your albuterol inhaler as a rescue medication Plan C = Crisis (instead of Plan B but only if Plan B stops working) - only use your albuterol nebulizer if you first try Plan B   the key is to stop smoking completely before smoking completely stops you! Make sure you check your oxygen saturation at your highest level of activity to be sure it stays over 90%  HRCT chest  12/14/21 Mild pulmonary fibrosis c/w RA  lung dz   12/24/2021  f/u ov/Fernando Lloyd re: RA lung dz maint on breztri   Chief Complaint  Patient presents with   Follow-up    Discuss CT scan.  Pt feels he is sleeping too much.  Mouth dryness.  Gets dry cough  Dyspnea:  limited by back and leg pain/ not checking sats  Cough: pos am congestion / white  Sleeping: on couch on side new better SABA use: rare neb  02: none  Covid status:   vax x 3/ infected at least once  Rec I will be referring you to the pulmonary fibrosis clinic Plan A = Automatic = Always=    Breztri Take 2 puffs first thing in am and then another 2 puffs about 12 hours later.  Plan B = Backup (to supplement plan A, not to replace it) Only use your albuterol inhaler as a rescue medication Plan C = Crisis (instead of Plan B but only if Plan B stops working) - only use your albuterol nebulizer if you first try Plan B  The key is to stop smoking completely before smoking completely stops you!   01/24/2022  f/u  ov/Fernando Lloyd re: ***   maint on ***  No chief complaint on file.   Dyspnea:  *** Cough: *** Sleeping: *** SABA use: *** 02: *** Covid status:   *** Lung cancer screening :  ***    No obvious day to day or daytime variability or assoc excess/ purulent sputum or mucus plugs or hemoptysis or cp or chest tightness, subjective wheeze or overt sinus or hb symptoms.   *** without nocturnal  or early am exacerbation  of respiratory  c/o's or need for noct saba. Also denies any obvious fluctuation of symptoms with weather or environmental changes or other aggravating or alleviating factors except as outlined above   No unusual exposure hx or h/o childhood pna/ asthma or knowledge of premature birth.  Current Allergies, Complete Past Medical History, Past Surgical History, Family History, and Social History were reviewed in [employer] record.  ROS  The following are not active complaints unless bolded Hoarseness, sore throat, dysphagia, dental problems, itching, sneezing,  nasal congestion or discharge of excess mucus or purulent secretions, ear ache,   fever, chills, sweats, unintended wt loss or wt gain, classically pleuritic or exertional cp,  orthopnea pnd or arm/hand swelling  or leg swelling, presyncope, palpitations, abdominal pain, anorexia, nausea, vomiting, diarrhea  or change in bowel habits or change in bladder habits, change in stools or change in urine, dysuria, hematuria,  rash, arthralgias, visual complaints, headache, numbness, weakness or ataxia or problems with walking or coordination,  change in mood or  memory.        No outpatient medications have been marked as taking for the 01/24/22 encounter (Appointment) with Tanda Rockers, MD.            Past Medical History:  Diagnosis Date   Anxiety    Ataxia    Hyperlipidemia    Hypertension    Myocardial infarct ([place]) 11/23/2014   RCA stent 3.5x23 DES in [place]   Rheumatoid arthritis ([place])         Objective:   Wts  01/24/2022       ***  12/24/2021     226  11/19/2021     232  10/22/21 230 lb 9.6 oz (104.6 kg)  10/19/21 235 lb (106.6 kg)  01/09/20 244 lb (110.7 kg)    Vital signs reviewed  01/24/2022  - Note at rest 02 sats  ***% on ***   General appearance:    *** Min bar***         Assessment

## 2022-01-24 ENCOUNTER — Encounter: Payer: Self-pay | Admitting: Pulmonary Disease

## 2022-01-24 ENCOUNTER — Ambulatory Visit: Admitting: Internal Medicine

## 2022-01-24 ENCOUNTER — Ambulatory Visit (INDEPENDENT_AMBULATORY_CARE_PROVIDER_SITE_OTHER): Admitting: Internal Medicine

## 2022-01-24 ENCOUNTER — Ambulatory Visit (INDEPENDENT_AMBULATORY_CARE_PROVIDER_SITE_OTHER): Admitting: Pulmonary Disease

## 2022-01-24 VITALS — BP 122/70 | HR 98 | Ht 72.0 in | Wt 212.0 lb

## 2022-01-24 DIAGNOSIS — F17210 Nicotine dependence, cigarettes, uncomplicated: Secondary | ICD-10-CM

## 2022-01-24 DIAGNOSIS — M0510 Rheumatoid lung disease with rheumatoid arthritis of unspecified site: Secondary | ICD-10-CM

## 2022-01-24 LAB — PULMONARY FUNCTION TEST
DL/VA % pred: 81 %
DL/VA: 3.38 ml/min/mmHg/L
DLCO cor % pred: 58 %
DLCO cor: 17.02 ml/min/mmHg
DLCO unc % pred: 58 %
DLCO unc: 17.02 ml/min/mmHg
FEF 25-75 Post: 3.55 L/sec
FEF 25-75 Pre: 2.86 L/sec
FEF2575-%Change-Post: 24 %
FEF2575-%Pred-Post: 114 %
FEF2575-%Pred-Pre: 91 %
FEV1-%Change-Post: 4 %
FEV1-%Pred-Post: 72 %
FEV1-%Pred-Pre: 69 %
FEV1-Post: 2.78 L
FEV1-Pre: 2.66 L
FEV1FVC-%Change-Post: 2 %
FEV1FVC-%Pred-Pre: 107 %
FEV6-%Change-Post: 2 %
FEV6-%Pred-Post: 69 %
FEV6-%Pred-Pre: 67 %
FEV6-Post: 3.35 L
FEV6-Pre: 3.27 L
FEV6FVC-%Change-Post: 0 %
FEV6FVC-%Pred-Post: 104 %
FEV6FVC-%Pred-Pre: 103 %
FVC-%Change-Post: 1 %
FVC-%Pred-Post: 66 %
FVC-%Pred-Pre: 64 %
FVC-Post: 3.36 L
FVC-Pre: 3.29 L
Post FEV1/FVC ratio: 83 %
Post FEV6/FVC ratio: 100 %
Pre FEV1/FVC ratio: 81 %
Pre FEV6/FVC Ratio: 99 %
RV % pred: 100 %
RV: 2.4 L
TLC % pred: 75 %
TLC: 5.6 L

## 2022-01-24 MED ORDER — PREDNISONE 10 MG PO TABS: 40.0000 mg | ORAL_TABLET | Freq: Every day | ORAL | 1 refills | Status: AC

## 2022-01-24 MED ORDER — MYCOPHENOLATE MOFETIL 500 MG PO TABS: 500.0000 mg | ORAL_TABLET | Freq: Two times a day (BID) | ORAL | 1 refills | Status: DC

## 2022-01-24 NOTE — Progress Notes (Signed)
PFT done today. 

## 2022-01-24 NOTE — Progress Notes (Unsigned)
Fernando Lloyd    [id number]    1960/02/18  Primary Care Physician:Stamey, Girtha Rm, FNP  Referring Physician: Sheran Spine, [place],  [place] [id number]  Chief complaint: Consult for interstitial lung disease  HPI: 62 y.o. who  has a past medical history of Anxiety, Ataxia, Hyperlipidemia, Hypertension, Myocardial infarct ([place]) (11/23/2014), and Rheumatoid arthritis ([place]).   Chief complaint is dyspnea on exertion for many months.  He noticed some blood in the sputum 1 to 2 months.  It was a single isolated episode which did not require.  Ago.  Also has generalized weakness, diarrhea for the past 2 months.  Denies any cough, fevers, chills. Has history of rheumatoid arthritis for the past 5 years.  He follows with [facility] rheumatology and was previously on Humira and Rinvoq.  This was too expensive from the insurance and was switched to prednisone 5 mg a day, methotrexate and Arava.  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  No feather pillows or comforters ILD questionnaire 01/26/2022-negative Smoking history: 1 pack/day for 42 years.  Continues to smoke Travel history: Previously lived in [place].  No significant recent travel Relevant family history: No family history of lung disease   Outpatient Encounter Medications as of 01/24/2022  Medication Sig   albuterol (PROAIR HFA) 108 (90 Base) MCG/ACT inhaler 2 puffs up to every 4 hours as needed only  if your can't catch your breath   albuterol (PROVENTIL) (2.5 MG/3ML) 0.083% nebulizer solution Take 3 mLs (2.5 mg total) by nebulization every 4 (four) hours as needed.   ALPRAZolam (XANAX) 0.25 MG tablet Take 0.25 mg by mouth See admin instructions. Take 0.25 mg by mouth one to two times a day as needed for anxiety    benzonatate (TESSALON) 200 MG capsule Take 1 capsule (200 mg total) by mouth 3 (three) times daily as needed for cough.   Budeson-Glycopyrrol-Formoterol (BREZTRI AEROSPHERE) 160-9-4.8 MCG/ACT AERO Inhale 2 puffs into the lungs 2 (two) times daily.   famotidine (PEPCID) 20 MG tablet One twice daily   folic acid (FOLVITE) 1 MG tablet 1 tablet Orally Once a day for 60 days   leflunomide (ARAVA) 20 MG tablet Take 20 mg by mouth daily.   losartan (COZAAR) 50 MG tablet Take 1 tablet by mouth once daily (Patient taking differently: Take 50 mg by mouth daily.)   methotrexate (RHEUMATREX) 2.5 MG tablet Take 2.5 mg by mouth once a week.   predniSONE (DELTASONE) 10 MG tablet TAKE 1 TAB DAILY UNTIL FEELING BETTER THEN TAKE 1/2 TAB DAILY THEREAFTER AS DIRECTED   sertraline (ZOLOFT) 50 MG tablet Take 50 mg by mouth daily.   isosorbide mononitrate (IMDUR) 60 MG 24 hr tablet Take 1 tablet (60 mg total) by mouth daily.   [DISCONTINUED] albuterol (VENTOLIN HFA) 108 (90 Base) MCG/ACT inhaler Inhale 2 puffs into the lungs every 4 (four) hours as needed for wheezing or shortness of breath.   [DISCONTINUED] triamcinolone cream (KENALOG) 0.1 % Apply topically 2 (two) times daily.   No facility-administered encounter medications on file as of 01/24/2022.    Allergies as of 01/24/2022   (No Known Allergies)    Past Medical History:  Diagnosis Date   Anxiety    Ataxia    Hyperlipidemia  Hypertension    Myocardial infarct ([place]) 11/23/2014   RCA stent 3.5x23 DES in [place]   Rheumatoid arthritis [facility])     Past Surgical History:  Procedure Laterality Date   CARDIAC CATHETERIZATION N/A 11/23/2014   CARPAL TUNNEL RELEASE     CORONARY ANGIOPLASTY     2016   CORONARY ANGIOPLASTY WITH STENT PLACEMENT      Family History  Problem Relation Age of Onset   Dementia Mother    Breast cancer Mother    Other Father        unsure of history    Social History   Socioeconomic History   Marital status: Married     Spouse name: Not on file   Number of children: 1   Years of education: some college   Highest education level: Not on file  Occupational History   Not on file  Tobacco Use   Smoking status: Every Day    Packs/day: 2.00    Years: 43.00    Total pack years: 86.00    Types: Cigarettes   Smokeless tobacco: Never   Tobacco comments:    1 ppd 10/22/21 [place] Dome, CMA  Vaping Use   Vaping Use: Never used  Substance and Sexual Activity   Alcohol use: Yes    Alcohol/week: 12.0 standard drinks of alcohol    Types: 12 Cans of beer per week   Drug use: Never   Sexual activity: Not on file  Other Topics Concern   Not on file  Social History Narrative   Lives at home with his wife.   Right-handed.   1-2 cups per day.   Social Determinants of Health   Financial Resource Strain: Not on file  Food Insecurity: Not on file  Transportation Needs: Not on file  Physical Activity: Not on file  Stress: Not on file  Social Connections: Not on file  Intimate Partner Violence: Not on file    Review of systems: Review of Systems  Constitutional: Negative for fever and chills.  HENT: Negative.   Eyes: Negative for blurred vision.  Respiratory: as per HPI  Cardiovascular: Negative for chest pain and palpitations.  Gastrointestinal: Negative for vomiting, diarrhea, blood per rectum. Genitourinary: Negative for dysuria, urgency, frequency and hematuria.  Musculoskeletal: Negative for myalgias, back pain and joint pain.  Skin: Negative for itching and rash.  Neurological: Negative for dizziness, tremors, focal weakness, seizures and loss of consciousness.  Endo/Heme/Allergies: Negative for environmental allergies.  Psychiatric/Behavioral: Negative for depression, suicidal ideas and hallucinations.  All other systems reviewed and are negative.  Physical Exam: Blood pressure 122/70, pulse 98, height 6' (1.829 m), weight 212 lb (96.2 kg), SpO2 97 %. Gen:      No acute distress HEENT:   EOMI, sclera anicteric Neck:     No masses; no thyromegaly Lungs:    Clear to auscultation bilaterally; normal respiratory effort CV:         Regular rate and rhythm; no murmurs Abd:      + bowel sounds; soft, non-tender; no palpable masses, no distension Ext:    No edema; adequate peripheral perfusion Skin:      Warm and dry; no rash Neuro: alert and oriented x 3 Psych: normal mood and affect  Data Reviewed: Imaging: CTA 10/19/2021-no evidence of pulmonary embolism.  Diffuse bilateral interstitial thickening with subpleural reticulation.  Diffuse bronchial wall thickening  High-resolution CT 12/15/2021-mild pulmonary fibrosis in apical to basal gradient with septal thickening, groundglass.  No evidence of traction bronchiectasis,  bronchiolectasis or honeycombing.  Bronchial wall thickening.  Alternate diagnosis I have reviewed the images personally.  PFTs: 01/24/2022 FVC 3.36 [6 6%], FEV1 2.78 [72%], F/F 83, TLC 5.60 [75%], DLCO 17.02 [58%] Moderate restriction, diffusion defect  Labs:  Assessment:  Assessment for interstitial lung disease CT reviewed with alternate pattern of interstitial changes with no significant traction bronchiectasis or honeycombing.  This is an alternate pattern and is consistent with rheumatoid arthritis associated ILD.  Will check basic labs today including CBC, comprehensive metabolic panel, proBNP and hypersensitivity panel.  I will increase prednisone to 40 mg a day temporarily for quick control of symptoms Start CellCept 500 mg twice daily I will coordinate with his rheumatologist to see if he can come off methotrexate once we have him on a good dose of CellCept.  Plan/Recommendations: Check labs, CTD profile Increase prednisone to 40 mg a day Start CellCept 500 mg twice daily  Chilton Greathouse MD [facility] 01/24/2022, 3:43 PM  CC: Stamey, Verda Cumins, FNP

## 2022-01-24 NOTE — Patient Instructions (Signed)
Will get some basic labs today Check CBC, comprehensive metabolic panel, proBNP Increase prednisone to 40 mg a day Check hypersensitivity panel Start CellCept 500 mg twice daily Follow-up in 1 to 2 months.

## 2022-01-25 LAB — CBC WITH DIFFERENTIAL/PLATELET
Basophils Absolute: 0 10*3/uL (ref 0.0–0.1)
Basophils Relative: 0.1 % (ref 0.0–3.0)
Eosinophils Absolute: 0.1 10*3/uL (ref 0.0–0.7)
Eosinophils Relative: 0.9 % (ref 0.0–5.0)
HCT: 39.4 % (ref 39.0–52.0)
Hemoglobin: 13.6 g/dL (ref 13.0–17.0)
Lymphocytes Relative: 8.7 % — ABNORMAL LOW (ref 12.0–46.0)
Lymphs Abs: 0.9 10*3/uL (ref 0.7–4.0)
MCHC: 34.4 g/dL (ref 30.0–36.0)
MCV: 112 fl — ABNORMAL HIGH (ref 78.0–100.0)
Monocytes Absolute: 0.2 10*3/uL (ref 0.1–1.0)
Monocytes Relative: 2.3 % — ABNORMAL LOW (ref 3.0–12.0)
Neutro Abs: 9.2 10*3/uL — ABNORMAL HIGH (ref 1.4–7.7)
Neutrophils Relative %: 88 % — ABNORMAL HIGH (ref 43.0–77.0)
Platelets: 346 10*3/uL (ref 150.0–400.0)
RBC: 3.52 Mil/uL — ABNORMAL LOW (ref 4.22–5.81)
RDW: 24.3 % — ABNORMAL HIGH (ref 11.5–15.5)
WBC: 10.5 10*3/uL (ref 4.0–10.5)

## 2022-01-25 LAB — COMPREHENSIVE METABOLIC PANEL
ALT: 73 U/L — ABNORMAL HIGH (ref 0–53)
AST: 57 U/L — ABNORMAL HIGH (ref 0–37)
Albumin: 3.8 g/dL (ref 3.5–5.2)
Alkaline Phosphatase: 166 U/L — ABNORMAL HIGH (ref 39–117)
BUN: 13 mg/dL (ref 6–23)
CO2: 26 mEq/L (ref 19–32)
Calcium: 9.1 mg/dL (ref 8.4–10.5)
Chloride: 103 mEq/L (ref 96–112)
Creatinine, Ser: 0.7 mg/dL (ref 0.40–1.50)
GFR: 99.27 mL/min (ref >60.00)
Glucose, Bld: 126 mg/dL — ABNORMAL HIGH (ref 70–99)
Potassium: 4 mEq/L (ref 3.5–5.1)
Sodium: 137 mEq/L (ref 135–145)
Total Bilirubin: 0.8 mg/dL (ref 0.2–1.2)
Total Protein: 7 g/dL (ref 6.0–8.3)

## 2022-01-25 LAB — PRO B NATRIURETIC PEPTIDE: NT-Pro BNP: 136 pg/mL (ref 0–210)

## 2022-01-26 ENCOUNTER — Encounter: Payer: Self-pay | Admitting: Pulmonary Disease

## 2022-01-27 LAB — ANA,IFA RA DIAG PNL W/RFLX TIT/PATN
Anti Nuclear Antibody (ANA): NEGATIVE
Cyclic Citrullin Peptide Ab: 28 UNITS — ABNORMAL HIGH
Rheumatoid fact SerPl-aCnc: 536 IU/mL — ABNORMAL HIGH (ref <14)

## 2022-02-17 ENCOUNTER — Other Ambulatory Visit: Payer: Self-pay | Admitting: Pulmonary Disease

## 2022-02-17 DIAGNOSIS — M0510 Rheumatoid lung disease with rheumatoid arthritis of unspecified site: Secondary | ICD-10-CM

## 2022-03-18 ENCOUNTER — Encounter: Payer: Self-pay | Admitting: *Deleted

## 2022-03-18 ENCOUNTER — Other Ambulatory Visit: Payer: Self-pay | Admitting: *Deleted

## 2022-03-18 DIAGNOSIS — Z5181 Encounter for therapeutic drug level monitoring: Secondary | ICD-10-CM

## 2022-03-22 ENCOUNTER — Other Ambulatory Visit (INDEPENDENT_AMBULATORY_CARE_PROVIDER_SITE_OTHER)

## 2022-03-22 DIAGNOSIS — Z5181 Encounter for therapeutic drug level monitoring: Secondary | ICD-10-CM

## 2022-03-22 LAB — COMPREHENSIVE METABOLIC PANEL
ALT: 25 U/L (ref 0–53)
AST: 23 U/L (ref 0–37)
Albumin: 3.7 g/dL (ref 3.5–5.2)
Alkaline Phosphatase: 127 U/L — ABNORMAL HIGH (ref 39–117)
BUN: 8 mg/dL (ref 6–23)
CO2: 25 mEq/L (ref 19–32)
Calcium: 9.4 mg/dL (ref 8.4–10.5)
Chloride: 104 mEq/L (ref 96–112)
Creatinine, Ser: 0.81 mg/dL (ref 0.40–1.50)
GFR: 94.89 mL/min (ref >60.00)
Glucose, Bld: 126 mg/dL — ABNORMAL HIGH (ref 70–99)
Potassium: 3.7 mEq/L (ref 3.5–5.1)
Sodium: 138 mEq/L (ref 135–145)
Total Bilirubin: 0.7 mg/dL (ref 0.2–1.2)
Total Protein: 7.1 g/dL (ref 6.0–8.3)

## 2022-03-22 LAB — CBC WITH DIFFERENTIAL/PLATELET
Basophils Absolute: 0.1 10*3/uL (ref 0.0–0.1)
Basophils Relative: 0.5 % (ref 0.0–3.0)
Eosinophils Absolute: 0.1 10*3/uL (ref 0.0–0.7)
Eosinophils Relative: 0.8 % (ref 0.0–5.0)
HCT: 45.9 % (ref 39.0–52.0)
Hemoglobin: 15.5 g/dL (ref 13.0–17.0)
Lymphocytes Relative: 9.4 % — ABNORMAL LOW (ref 12.0–46.0)
Lymphs Abs: 1.4 10*3/uL (ref 0.7–4.0)
MCHC: 33.7 g/dL (ref 30.0–36.0)
MCV: 109.6 fl — ABNORMAL HIGH (ref 78.0–100.0)
Monocytes Absolute: 0.8 10*3/uL (ref 0.1–1.0)
Monocytes Relative: 5.2 % (ref 3.0–12.0)
Neutro Abs: 12.7 10*3/uL — ABNORMAL HIGH (ref 1.4–7.7)
Neutrophils Relative %: 84.1 % — ABNORMAL HIGH (ref 43.0–77.0)
Platelets: 298 10*3/uL (ref 150.0–400.0)
RBC: 4.19 Mil/uL — ABNORMAL LOW (ref 4.22–5.81)
RDW: 14.2 % (ref 11.5–15.5)
WBC: 15.1 10*3/uL — ABNORMAL HIGH (ref 4.0–10.5)

## 2022-03-23 ENCOUNTER — Other Ambulatory Visit: Payer: Self-pay | Admitting: Pulmonary Disease

## 2022-03-23 DIAGNOSIS — M0510 Rheumatoid lung disease with rheumatoid arthritis of unspecified site: Secondary | ICD-10-CM

## 2022-04-11 ENCOUNTER — Other Ambulatory Visit: Payer: Self-pay | Admitting: Internal Medicine

## 2022-04-13 ENCOUNTER — Encounter: Payer: Self-pay | Admitting: Pulmonary Disease

## 2022-04-13 ENCOUNTER — Ambulatory Visit: Admitting: Pulmonary Disease

## 2022-04-13 VITALS — BP 140/84 | HR 77 | Temp 98.3°F | Ht 73.0 in | Wt 211.6 lb

## 2022-04-13 DIAGNOSIS — M0510 Rheumatoid lung disease with rheumatoid arthritis of unspecified site: Secondary | ICD-10-CM | POA: Diagnosis not present

## 2022-04-13 DIAGNOSIS — Z5181 Encounter for therapeutic drug level monitoring: Secondary | ICD-10-CM

## 2022-04-13 MED ORDER — VARENICLINE TARTRATE (STARTER) 0.5 MG X 11 & 1 MG X 42 PO TBPK: ORAL_TABLET | ORAL | 0 refills | Status: DC

## 2022-04-13 MED ORDER — MYCOPHENOLATE MOFETIL 500 MG PO TABS
1000.0000 mg | ORAL_TABLET | Freq: Two times a day (BID) | ORAL | 1 refills | Status: DC
Start: 2022-04-13 — End: 2023-03-28

## 2022-04-13 MED ORDER — SULFAMETHOXAZOLE-TRIMETHOPRIM 800-160 MG PO TABS: 1.0000 | ORAL_TABLET | ORAL | 5 refills | Status: DC

## 2022-04-13 MED ORDER — NICOTINE 21 MG/24HR TD PT24: 21.0000 mg | MEDICATED_PATCH | Freq: Every day | TRANSDERMAL | 0 refills | Status: DC

## 2022-04-13 NOTE — Patient Instructions (Signed)
Stop the methotrexate We will start you on Bactrim double strength 3 times a week Increase CellCept to 1 g twice daily.  Will send in a new prescription Check CMP and CBC today Please make a follow-up appointment with Azucena Fallen Continue prednisone at current dose of 20 mg a day Will prescribe nicotine patches and Chantix for smoking cessation Follow-up in 2 months

## 2022-04-13 NOTE — Progress Notes (Addendum)
Fernando Lloyd    [id number]    02-Apr-1960  Primary Care Physician:Stamey, Verda Cumins, FNP  Referring Physician: Genia Hotter, FNP [address],  [place] [address]  Chief complaint: Follow-up for interstitial lung disease  HPI: 62 y.o. who  has a past medical history of Anxiety, Ataxia, Hyperlipidemia, Hypertension, Myocardial infarct (11/23/2014), and Rheumatoid arthritis.   Chief complaint is dyspnea on exertion for many months.  He noticed some blood in the sputum 1 to 2 months.  It was a single isolated episode which did not require.  Ago.  Also has generalized weakness, diarrhea for the past 2 months.  Denies any cough, fevers, chills. Has history of rheumatoid arthritis for the past 5 years.  He follows with [facility] rheumatology and was previously on Humira and Rinvoq.  This was too expensive from the insurance and was switched to prednisone 5 mg a day, methotrexate and Arava.  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  No feather pillows or comforters ILD questionnaire 01/26/2022-negative Smoking history: 1 pack/day for 42 years.  Continues to smoke Travel history: Previously lived in [place].  No significant recent travel Relevant family history: No family history of lung disease  Interim history: Started on CellCept at last visit he is tolerating it well.  He is also on prednisone which he is reduced to 20 mg a day.  States that his arthritis symptoms are doing well Continues to smoke   Outpatient Encounter Medications as of 04/13/2022  Medication Sig   albuterol (PROAIR HFA) 108 (90 Base) MCG/ACT inhaler 2 puffs up to every 4 hours as needed only  if your can't catch your breath   albuterol (PROVENTIL) (2.5 MG/3ML) 0.083% nebulizer solution Take 3 mLs (2.5 mg total)  by nebulization every 4 (four) hours as needed.   ALPRAZolam (XANAX) 0.25 MG tablet Take 0.25 mg by mouth See admin instructions. Take 0.25 mg by mouth one to two times a day as needed for anxiety   Budeson-Glycopyrrol-Formoterol (BREZTRI AEROSPHERE) 160-9-4.8 MCG/ACT AERO Inhale 2 puffs into the lungs 2 (two) times daily.   famotidine (PEPCID) 20 MG tablet One twice daily   folic acid (FOLVITE) 1 MG tablet 1 tablet Orally Once a day for 60 days   leflunomide (ARAVA) 20 MG tablet Take 20 mg by mouth daily.   methotrexate (RHEUMATREX) 2.5 MG tablet Take 2.5 mg by mouth once a week.   mycophenolate (CELLCEPT) 500 MG tablet TAKE 1 TABLET BY MOUTH TWICE A DAY   pantoprazole (PROTONIX) 40 MG tablet TAKE 1 TABLET (40 MG TOTAL) BY MOUTH DAILY. TAKE 30-60 MIN BEFORE FIRST MEAL OF THE DAY   predniSONE (DELTASONE) 10 MG tablet TAKE 4 TABLETS BY MOUTH DAILY WITH BREAKFAST.   sertraline (ZOLOFT) 50 MG tablet Take 50 mg by mouth daily.   isosorbide mononitrate (IMDUR) 60 MG 24 hr tablet Take 1 tablet (60 mg total) by mouth daily.   [DISCONTINUED] benzonatate (TESSALON) 200 MG capsule Take 1 capsule (200 mg total) by mouth 3 (three) times daily as needed for cough.   [DISCONTINUED] losartan (COZAAR) 50 MG tablet Take 1 tablet by mouth once daily (Patient taking differently: Take 50 mg by mouth daily.)   No facility-administered encounter medications on file as of 04/13/2022.    Physical  Exam: Blood pressure (!) 140/84, pulse 77, temperature 98.3 F (36.8 C), temperature source Oral, height  (1.854 m), weight 211 lb 9.6 oz (96 kg), SpO2 96 %. Gen:      No acute distress HEENT:  EOMI, sclera anicteric Neck:     No masses; no thyromegaly Lungs:    Clear to auscultation bilaterally; normal respiratory effort CV:         Regular rate and rhythm; no murmurs Abd:      + bowel sounds; soft, non-tender; no palpable masses, no distension Ext:    No edema; adequate peripheral perfusion Skin:      Warm and  dry; no rash Neuro: alert and oriented x 3 Psych: normal mood and affect   Data Reviewed: Imaging: CTA 10/19/2021-no evidence of pulmonary embolism.  Diffuse bilateral interstitial thickening with subpleural reticulation.  Diffuse bronchial wall thickening  High-resolution CT 12/14/2021-mild pulmonary fibrosis in apical to basal gradient with septal thickening, groundglass.  No evidence of traction bronchiectasis, bronchiolectasis or honeycombing.  Bronchial wall thickening.  Alternate diagnosis I have reviewed the images personally.  PFTs: 01/24/2022 FVC 3.36 [6 6%], FEV1 2.78 [72%], F/F 83, TLC 5.60 [75%], DLCO 17.02 [58%] Moderate restriction, diffusion defect  Labs: CMP 03/22/2022-within normal limits, CBC 3/90/24-WBC 15.1  Assessment:  RA-ILD CT reviewed with alternate pattern of interstitial changes with no significant traction bronchiectasis or honeycombing.  This is an alternate pattern and is consistent with rheumatoid arthritis associated ILD.  Started on CellCept 500 mg twice daily and is tolerating it well.  Will increase dose to 1000 mg twice daily Check labs for monitoring Will stop the methotrexate since he is on a good dose of CellCept He continues on prednisone at 20 mg a day.  I have advised him to follow-up with his rheumatologist to look for alternate medications for his joint symptoms. Start Bactrim double strength 3 times a week  Active smoker Discussed smoking cessation in detail.  He is ready to quit Prescribe nicotine patches and Chantix.  Time spent counseling-3 minutes.  Reassess at return visit.  Plan/Recommendations: Increase CellCept to 1 g twice daily Start Bactrim Continue prednisone 20 mg/day Check labs for monitoring Nicotine patches, Chantix Reestablish care with rheumatology.  Chilton Greathouse MD [facility] 04/13/2022, 3:56 PM  CC: Stamey, Verda Cumins, FNP  Addendum: Received office note from [facility] rheumatology  dated 04/21/2022 Patient is currently off methotrexate and is continuing leflunomide. Harriette Ohara worked before but he was on methotrexate due to Careers information officer.  If his rheumatoid arthritis symptoms worsen off methotrexate then they can appeal Harriette Ohara now that he is already tried methotrexate as preferred therapy.  Chilton Greathouse MD [facility] 04/26/2022, 2:31 PM

## 2022-04-14 LAB — COMPREHENSIVE METABOLIC PANEL
ALT: 31 U/L (ref 0–53)
AST: 28 U/L (ref 0–37)
Albumin: 3.9 g/dL (ref 3.5–5.2)
Alkaline Phosphatase: 118 U/L — ABNORMAL HIGH (ref 39–117)
BUN: 10 mg/dL (ref 6–23)
CO2: 29 mEq/L (ref 19–32)
Calcium: 9.6 mg/dL (ref 8.4–10.5)
Chloride: 103 mEq/L (ref 96–112)
Creatinine, Ser: 0.95 mg/dL (ref 0.40–1.50)
GFR: 86.1 mL/min (ref >60.00)
Glucose, Bld: 88 mg/dL (ref 70–99)
Potassium: 4.4 mEq/L (ref 3.5–5.1)
Sodium: 139 mEq/L (ref 135–145)
Total Bilirubin: 0.6 mg/dL (ref 0.2–1.2)
Total Protein: 7.1 g/dL (ref 6.0–8.3)

## 2022-04-14 LAB — CBC WITH DIFFERENTIAL/PLATELET
Basophils Absolute: 0.1 10*3/uL (ref 0.0–0.1)
Basophils Relative: 0.9 % (ref 0.0–3.0)
Eosinophils Absolute: 0.1 10*3/uL (ref 0.0–0.7)
Eosinophils Relative: 0.6 % (ref 0.0–5.0)
HCT: 49 % (ref 39.0–52.0)
Hemoglobin: 16.5 g/dL (ref 13.0–17.0)
Lymphocytes Relative: 12.5 % (ref 12.0–46.0)
Lymphs Abs: 1.7 10*3/uL (ref 0.7–4.0)
MCHC: 33.7 g/dL (ref 30.0–36.0)
MCV: 105.8 fl — ABNORMAL HIGH (ref 78.0–100.0)
Monocytes Absolute: 0.9 10*3/uL (ref 0.1–1.0)
Monocytes Relative: 6.7 % (ref 3.0–12.0)
Neutro Abs: 10.7 10*3/uL — ABNORMAL HIGH (ref 1.4–7.7)
Neutrophils Relative %: 79.3 % — ABNORMAL HIGH (ref 43.0–77.0)
Platelets: 289 10*3/uL (ref 150.0–400.0)
RBC: 4.63 Mil/uL (ref 4.22–5.81)
RDW: 13.9 % (ref 11.5–15.5)
WBC: 13.4 10*3/uL — ABNORMAL HIGH (ref 4.0–10.5)

## 2022-05-18 ENCOUNTER — Other Ambulatory Visit: Payer: Self-pay | Admitting: Pulmonary Disease

## 2022-05-18 DIAGNOSIS — M0510 Rheumatoid lung disease with rheumatoid arthritis of unspecified site: Secondary | ICD-10-CM

## 2022-05-19 ENCOUNTER — Other Ambulatory Visit: Payer: Self-pay | Admitting: Internal Medicine

## 2022-05-23 ENCOUNTER — Emergency Department (HOSPITAL_COMMUNITY)
Admission: EM | Admit: 2022-05-23 | Discharge: 2022-05-23 | Disposition: A | Discharge status: Home / Self Care | Attending: Emergency Medicine | Admitting: Emergency Medicine

## 2022-05-23 ENCOUNTER — Emergency Department (HOSPITAL_COMMUNITY)

## 2022-05-23 DIAGNOSIS — S0990XA Unspecified injury of head, initial encounter: Secondary | ICD-10-CM | POA: Diagnosis present

## 2022-05-23 DIAGNOSIS — Y92094 Garage of other non-institutional residence as the place of occurrence of the external cause: Secondary | ICD-10-CM | POA: Insufficient documentation

## 2022-05-23 DIAGNOSIS — S0101XA Laceration without foreign body of scalp, initial encounter: Secondary | ICD-10-CM | POA: Diagnosis not present

## 2022-05-23 DIAGNOSIS — W19XXXA Unspecified fall, initial encounter: Secondary | ICD-10-CM

## 2022-05-23 DIAGNOSIS — W108XXA Fall (on) (from) other stairs and steps, initial encounter: Secondary | ICD-10-CM | POA: Insufficient documentation

## 2022-05-23 DIAGNOSIS — Y9301 Activity, walking, marching and hiking: Secondary | ICD-10-CM | POA: Insufficient documentation

## 2022-05-23 LAB — CBC
HCT: 47.5 % (ref 39.0–52.0)
Hemoglobin: 16 g/dL (ref 13.0–17.0)
MCH: 34.3 pg — ABNORMAL HIGH (ref 26.0–34.0)
MCHC: 33.7 g/dL (ref 30.0–36.0)
MCV: 101.9 fL — ABNORMAL HIGH (ref 80.0–100.0)
Platelets: 355 10*3/uL (ref 150–400)
RBC: 4.66 MIL/uL (ref 4.22–5.81)
RDW: 13.2 % (ref 11.5–15.5)
WBC: 14.5 10*3/uL — ABNORMAL HIGH (ref 4.0–10.5)
nRBC: 0 % (ref 0.0–0.2)

## 2022-05-23 LAB — BASIC METABOLIC PANEL
Anion gap: 11 (ref 5–15)
BUN: 9 mg/dL (ref 8–23)
CO2: 22 mmol/L (ref 22–32)
Calcium: 8.7 mg/dL — ABNORMAL LOW (ref 8.9–10.3)
Chloride: 103 mmol/L (ref 98–111)
Creatinine, Ser: 0.9 mg/dL (ref 0.61–1.24)
GFR, Estimated: >60 mL/min (ref >60)
Glucose, Bld: 90 mg/dL (ref 70–99)
Potassium: 3.6 mmol/L (ref 3.5–5.1)
Sodium: 136 mmol/L (ref 135–145)

## 2022-05-23 MED ORDER — CEPHALEXIN 500 MG PO CAPS: 500.0000 mg | ORAL_CAPSULE | Freq: Two times a day (BID) | ORAL | 0 refills | Status: AC

## 2022-05-23 MED ORDER — CEPHALEXIN 500 MG PO CAPS: 500.0000 mg | ORAL_CAPSULE | Freq: Two times a day (BID) | ORAL | 0 refills | Status: DC

## 2022-05-23 MED ORDER — LIDOCAINE-EPINEPHRINE (PF) 2 %-1:200000 IJ SOLN
20.0000 mL | Freq: Once | INTRAMUSCULAR | Status: AC
  Administered 2022-05-23: 20 mL via INTRADERMAL
  Filled 2022-05-23: qty 20

## 2022-05-23 MED ORDER — HYDROMORPHONE HCL 1 MG/ML IJ SOLN
1.0000 mg | Freq: Once | INTRAMUSCULAR | Status: AC
  Administered 2022-05-23: 1 mg via INTRAVENOUS
  Filled 2022-05-23: qty 1

## 2022-05-23 NOTE — Discharge Instructions (Addendum)
The stitches that were placed in your scalp should be removed in 7-10 days (May 27-30th).  This can be done at your doctor's office, urgent care, or the ER.   You have 5 stitches placed in the ER today.  You should keep this wound dry for the next 2 days. After that you can shower regularly with soap and water.  You can apply ice packs to the top of your scalp to help with the swelling.  If you notice redness spreading along the skin near the laceration site, you should start taking the antibiotic prescribed.  *  Please try to schedule follow-up appointment your doctor's office 1 week from now.  Your doctor can reassess your concussion symptoms, remove the sutures from your head, and look at your wound again.  *  You may have a concussion from your injury today.  Most people fully recover from a concussion within 2 to 4 weeks.  If you feel woozy, forgetful, lightheaded or dizzy, please do not drive.  Do not participate in dangerous activities.  You can take ibuprofen and Tylenol as needed at home for headache.  If you have severe worsening headache, persistent vomiting, loss of consciousness, lethargy, strokelike symptoms, including difficulty with speech, loss of vision, numbness or weakness of the arms or legs, please return to the emergency department.  These may be signs of a worsening head injury that need immediate attention.

## 2022-05-23 NOTE — ED Notes (Signed)
Pt's head cleaned and wrapped. Pt ambulated around room.

## 2022-05-23 NOTE — ED Triage Notes (Signed)
Pt to ED via EMS from home. Pt had mechanical fall on steps in garage. Pt states he fell backwards down about 3 steps. Pt hit back of his head and has lac to back of head bandaged PTA. Pt c/ mid back pain. No LOC. No blood thinners. EMS applied c-collar PTA but pt removed d/t pain. Pt denies neck pain. Pt denies nausea.    EMS Vitals: 96%  85 HR 193/95

## 2022-05-23 NOTE — ED Provider Notes (Signed)
[facility] AT [facility] Provider Note   CSN: [id number] Arrival date & time: 05/23/22  1954     History  Chief Complaint  Patient presents with   Fernando Lloyd is a 62 y.o. male presenting from home with mechanical fall and head injury.  Patient reports that he fell backwards about 3 stairs while going into the house and sustained a laceration to the back of his scalp.  He denies anticoagulation use.  HPI     Home Medications Prior to Admission medications   Medication Sig Start Date End Date Taking? Authorizing Provider  cephALEXin (KEFLEX) 500 MG capsule Take 1 capsule (500 mg total) by mouth 2 (two) times daily for 7 days. 05/23/22 05/30/22 Yes Laquasha Groome, Kermit Balo, MD  albuterol [facility] HFA) 108 [phone] Base) MCG/ACT inhaler 2 puffs up to every 4 hours as needed only  if your can't catch your breath 11/19/21   Nyoka Cowden, MD  albuterol (PROVENTIL) (2.5 MG/3ML) 0.083% nebulizer solution Take 3 mLs (2.5 mg total) by nebulization every 4 (four) hours as needed. 11/19/21   Nyoka Cowden, MD  ALPRAZolam Prudy Feeler) 0.25 MG tablet Take 0.25 mg by mouth See admin instructions. Take 0.25 mg by mouth one to two times a day as needed for anxiety 04/30/18   [provider]  Budeson-Glycopyrrol-Formoterol (BREZTRI AEROSPHERE) 160-9-4.8 MCG/ACT AERO Inhale 2 puffs into the lungs 2 (two) times daily. 12/24/21   Nyoka Cowden, MD  famotidine (PEPCID) 20 MG tablet One twice daily 11/19/21   Nyoka Cowden, MD  folic acid (FOLVITE) 1 MG tablet 1 tablet Orally Once a day for 60 days 10/27/21   [provider]  isosorbide mononitrate (IMDUR) 60 MG 24 hr tablet Take 1 tablet (60 mg total) by mouth daily. 10/25/19 10/22/21  Harlene Salts A, PA-C  leflunomide (ARAVA) 20 MG tablet Take 20 mg by mouth daily. 08/24/21   [provider]  mycophenolate (CELLCEPT) 500 MG tablet Take 2 tablets (1,000 mg total) by mouth 2 (two) times  daily. 04/13/22   Mannam, Colbert Coyer, MD  nicotine (NICODERM CQ - DOSED IN MG/24 HOURS) 21 mg/24hr patch Place 1 patch (21 mg total) onto the skin daily. 04/13/22   Mannam, Colbert Coyer, MD  pantoprazole (PROTONIX) 40 MG tablet TAKE 1 TABLET (40 MG TOTAL) BY MOUTH DAILY. TAKE 30-60 MIN BEFORE FIRST MEAL OF THE DAY 04/12/22   Mannam, Colbert Coyer, MD  predniSONE (DELTASONE) 10 MG tablet TAKE 2 TABLETS (20mg ) BY MOUTH DAILY WITH BREAKFAST. 05/18/22   Mannam, Colbert Coyer, MD  sertraline (ZOLOFT) 50 MG tablet Take 50 mg by mouth daily. 09/18/21   [provider]  sulfamethoxazole-trimethoprim (BACTRIM DS) 800-160 MG tablet Take 1 tablet by mouth 3 (three) times a week. 04/13/22   Mannam, Colbert Coyer, MD  Varenicline Tartrate, Starter, (CHANTIX STARTING MONTH PAK) 0.5 MG X 11 & 1 MG X 42 TBPK Take 1 0.5 mg tablet once daily for 3 days, increase to 1 0.5 mg tablet twice daily for 4 days, increase to 1 1 mg tablet twice daily. 04/13/22   Chilton Greathouse, MD      Allergies    Patient has no known allergies.    Review of Systems   Review of Systems  Physical Exam Updated Vital Signs BP (!) 172/98   Pulse 84   Temp 99.5 F (37.5 C) (Oral)   Resp 16   SpO2 94%  Physical Exam Constitutional:      General:  He is not in acute distress. HENT:     Head: Normocephalic.      Comments: 4 cm linear laceration to left parietal occipital scalp with small active bleeding Eyes:     Conjunctiva/sclera: Conjunctivae normal.     Pupils: Pupils are equal, round, and reactive to light.  Cardiovascular:     Rate and Rhythm: Normal rate and regular rhythm.  Pulmonary:     Effort: Pulmonary effort is normal. No respiratory distress.  Abdominal:     General: There is no distension.     Tenderness: There is no abdominal tenderness.  Skin:    General: Skin is warm and dry.  Neurological:     General: No focal deficit present.     Mental Status: He is alert. Mental status is at baseline.     ED Results / Procedures /  Treatments   Labs (all labs ordered are listed, but only abnormal results are displayed) Labs Reviewed  BASIC METABOLIC PANEL - Abnormal; Notable for the following components:      Result Value   Calcium 8.7 (*)    All other components within normal limits  CBC - Abnormal; Notable for the following components:   WBC 14.5 (*)    MCV 101.9 (*)    MCH 34.3 (*)    All other components within normal limits    EKG EKG Interpretation  Date/Time:  Monday May 23 2022 20:12:44 EDT Ventricular Rate:  83 PR Interval:  156 QRS Duration: 86 QT Interval:  435 QTC Calculation: 512 R Axis:   -17 Text Interpretation: Sinus rhythm Abnormal R-wave progression, early transition Prolonged QT interval Confirmed by Alvester Chou [phone]) on 05/23/2022 9:41:02 PM  Radiology CT Cervical Spine Wo Contrast  Result Date: 05/23/2022 CLINICAL DATA:  Posterior scalp hematoma, moderate head trauma EXAM: CT CERVICAL SPINE WITHOUT CONTRAST TECHNIQUE: Multidetector CT imaging of the cervical spine was performed without intravenous contrast. Multiplanar CT image reconstructions were also generated. RADIATION DOSE REDUCTION: This exam was performed according to the departmental dose-optimization program which includes automated exposure control, adjustment of the mA and/or kV according to patient size and/or use of iterative reconstruction technique. COMPARISON:  None Available. FINDINGS: Alignment: Straightening of the cervical spine due to bony fusion across the C5-6 disc space. Otherwise alignment is anatomic. Skull base and vertebrae: No acute fracture. No primary bone lesion or focal pathologic process. Soft tissues and spinal canal: No prevertebral fluid or swelling. No visible canal hematoma. Disc levels: Bony fusion across the C5-6 disc space. Multilevel spondylosis greatest at the C6-7 level. Mild diffuse facet hypertrophy. Upper chest: Airway is patent. Visualized portions of the lung apices demonstrate emphysema  and scarring. Other: Reconstructed images demonstrate no additional findings. IMPRESSION: 1. No acute cervical spine fracture. 2. Multilevel cervical spondylosis, with bony fusion across the C5-6 disc space. Electronically Signed   By: Sharlet Salina M.D.   On: 05/23/2022 22:26   CT Head Wo Contrast  Result Date: 05/23/2022 CLINICAL DATA:  Head trauma, posterior scalp hematoma EXAM: CT HEAD WITHOUT CONTRAST TECHNIQUE: Contiguous axial images were obtained from the base of the skull through the vertex without intravenous contrast. RADIATION DOSE REDUCTION: This exam was performed according to the departmental dose-optimization program which includes automated exposure control, adjustment of the mA and/or kV according to patient size and/or use of iterative reconstruction technique. COMPARISON:  None Available. FINDINGS: Brain: No acute infarct or hemorrhage. Lateral ventricles and midline structures are unremarkable. No acute extra-axial fluid collections. No mass  effect. Vascular: No hyperdense vessel or unexpected calcification. Skull: Left parietal scalp laceration and hematoma. No underlying fracture. The remainder of the calvarium is unremarkable. Sinuses/Orbits: No acute finding. Other: None. IMPRESSION: 1. Left parietal scalp hematoma and laceration. No underlying fracture. 2. No acute intracranial process. Electronically Signed   By: Sharlet Salina M.D.   On: 05/23/2022 22:22    Procedures .Marland KitchenLaceration Repair  Date/Time: 05/23/2022 10:19 PM  Performed by: Terald Sleeper, MD Authorized by: Terald Sleeper, MD   Consent:    Consent obtained:  Verbal   Consent given by:  Patient   Risks discussed:  Infection, pain, poor wound healing and poor cosmetic result Universal protocol:    Procedure explained and questions answered to patient or proxy's satisfaction: yes     Site/side marked: yes     Immediately prior to procedure, a time out was called: yes     Patient identity confirmed:  Arm  band Anesthesia:    Anesthesia method:  Local infiltration   Local anesthetic:  Lidocaine 1% WITH epi Laceration details:    Location:  Scalp   Scalp location:  Occipital   Length (cm):  4   Depth (mm):  4 Pre-procedure details:    Preparation:  Imaging obtained to evaluate for foreign bodies Exploration:    Imaging outcome: foreign body not noted     Wound exploration: wound explored through full range of motion     Contaminated: no   Treatment:    Area cleansed with:  Saline   Amount of cleaning:  Standard   Irrigation solution:  Sterile saline Skin repair:    Repair method:  Sutures   Suture size:  3-0   Suture material:  Prolene   Suture technique:  Simple interrupted   Number of sutures:  5 Approximation:    Approximation:  Close Repair type:    Repair type:  Simple Post-procedure details:    Dressing:  Non-adherent dressing   Procedure completion:  Tolerated well, no immediate complications     Medications Ordered in ED Medications  lidocaine-EPINEPHrine (XYLOCAINE W/EPI) 2 %-1:200000 (PF) injection 20 mL (20 mLs Intradermal Given 05/23/22 2010)  HYDROmorphone (DILAUDID) injection 1 mg (1 mg Intravenous Given 05/23/22 2018)  HYDROmorphone (DILAUDID) injection 1 mg (1 mg Intravenous Given 05/23/22 2151)    ED Course/ Medical Decision Making/ A&P Clinical Course as of 05/23/22 2234  Mon May 23, 2022  2028 5 sutures placed in scalp lac and hemostasis achieved. [MT]  2231 Patient reassessed.  Wife is present at the bedside.  He is ambulating steadily.  He is tolerating p.o. fluids well.  His headache is improved with medications given.  We discussed concussion precautions, driving precautions, his wife will be escorting him home.  I do think he is stable for discharge at this time. [MT]    Clinical Course User Index [MT] Jerzy Crotteau, Kermit Balo, MD                             Medical Decision Making Amount and/or Complexity of Data Reviewed Labs: ordered. Radiology:  ordered.  Risk Prescription drug management.   Patient was presented to ED by EMS after mechanical fall in his house.  He has an isolated injury to the top of his head as well as a headache.  No loss of consciousness reported.  Supplemental history is provided by EMS and the patient's arrival.  Patient's wound was irrigated, cleaned, closed  with sutures.  The wound was hemostatic during his observation here in the ED.  Patient was given 2 rounds of IV pain medications for pain and has had near the wound site.  I ordered and personally reviewed & interpreted the patient's trauma imaging, which did not show any acute traumatic injuries.  I also reviewed the patient's lab test, which shows stable hemoglobin level.  No evidence of significant blood loss.  Patient's family is present at the bedside at the time of discharge.  At this time I do think the patient would be stable for home discharge with close follow-up.  We discussed concussion precautions.  His family can take him safely home.        Final Clinical Impression(s) / ED Diagnoses Final diagnoses:  Laceration of scalp, initial encounter  Injury of head, initial encounter  Fall, initial encounter    Rx / DC Orders ED Discharge Orders          Ordered    cephALEXin (KEFLEX) 500 MG capsule  2 times daily        05/23/22 2232              Terald Sleeper, MD 05/23/22 2235

## 2022-06-09 ENCOUNTER — Encounter: Payer: Self-pay | Admitting: Pulmonary Disease

## 2022-06-09 ENCOUNTER — Ambulatory Visit: Admitting: Pulmonary Disease

## 2022-06-09 VITALS — BP 130/80 | HR 92 | Temp 98.3°F | Ht 73.0 in | Wt 199.2 lb

## 2022-06-09 DIAGNOSIS — Z5181 Encounter for therapeutic drug level monitoring: Secondary | ICD-10-CM | POA: Diagnosis not present

## 2022-06-09 DIAGNOSIS — M0510 Rheumatoid lung disease with rheumatoid arthritis of unspecified site: Secondary | ICD-10-CM | POA: Diagnosis not present

## 2022-06-09 MED ORDER — MYCOPHENOLATE MOFETIL 500 MG PO TABS: ORAL_TABLET | ORAL | 5 refills | Status: DC

## 2022-06-09 NOTE — Patient Instructions (Signed)
Your labs recently were okay Increase CellCept to 1.5 g twice daily Will order high-res CT and PFTs in 4 months Return to clinic after these tests

## 2022-06-09 NOTE — Progress Notes (Signed)
Chaise Goldsboro    [id number]    April 11, 1960  Primary Care Physician:Stamey, Verda Cumins, FNP  Referring Physician: Genia Hotter, FNP [address]  [place] [address]  Chief complaint: Follow-up for interstitial lung disease, RA ILD Started on Cellcept January 2024  HPI: 62 y.o. who  has a past medical history of Anxiety, Ataxia, Hyperlipidemia, Hypertension, Myocardial infarct (HCC) (11/23/2014), and Rheumatoid arthritis (HCC).   Chief complaint is dyspnea on exertion for many months.  He noticed some blood in the sputum 1 to 2 months.  It was a single isolated episode which did not require.  Ago.  Also has generalized weakness, diarrhea for the past 2 months.  Denies any cough, fevers, chills. Has history of rheumatoid arthritis for the past 5 years.  He follows with [facility] rheumatology and was previously on Humira and Rinvoq.  This was too expensive from the insurance and was switched to prednisone 5 mg a day, methotrexate and Arava.  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  No feather pillows or comforters ILD questionnaire 01/26/2022-negative Smoking history: 1 pack/day for 42 years.  Continues to smoke Travel history: Previously lived in [place].  No significant recent travel Relevant family history: No family history of lung disease  Interim history: Started on CellCept in Jan and he is tolerating it well.  He is also on prednisone which he is reduced to 20 mg a day.  States that his arthritis symptoms are doing well Continues to smoke  Recently had COVID infection 2 weeks ago.  It was a mild case and did not require hospitalization.  However notes increased cough after the infection During his infection he had a fall and laceration of his scalp which required ED visit  and sutures.  All ED visits and imaging records reviewed.  Received and reviewed office note from [facility] rheumatology dated 04/21/2022 Patient is currently off methotrexate and is continuing leflunomide. Harriette Ohara worked before but he was on methotrexate due to Careers information officer.  If his rheumatoid arthritis symptoms worsen off methotrexate then they can appeal Harriette Ohara now that he is already tried methotrexate as preferred therapy.  Outpatient Encounter Medications as of 06/09/2022  Medication Sig   albuterol (PROVENTIL) (2.5 MG/3ML) 0.083% nebulizer solution Take 3 mLs (2.5 mg total) by nebulization every 4 (four) hours as needed.   albuterol (VENTOLIN HFA) 108 (90 Base) MCG/ACT inhaler INHALE 2 PUFFS INTO THE LUNGS EVERY 4 HOURS AS NEEDED FOR WHEEZING OR SHORTNESS OF BREATH.   ALPRAZolam (XANAX) 0.25 MG tablet Take 0.25 mg by mouth See admin instructions. Take 0.25 mg by mouth one to two times a day as needed for anxiety   Budeson-Glycopyrrol-Formoterol (BREZTRI AEROSPHERE) 160-9-4.8 MCG/ACT AERO Inhale 2 puffs into the lungs 2 (two) times daily.   famotidine (PEPCID) 20 MG tablet One twice daily   folic acid (FOLVITE) 1 MG tablet 1 tablet Orally Once a day for 60 days   leflunomide (ARAVA) 20 MG tablet Take 20 mg by mouth daily.   mycophenolate (CELLCEPT) 500 MG tablet Take 2 tablets (1,000 mg total) by mouth 2 (two) times daily.   pantoprazole (PROTONIX) 40 MG tablet TAKE 1 TABLET (40 MG TOTAL) BY MOUTH DAILY. TAKE 30-60 MIN BEFORE FIRST MEAL OF THE DAY   predniSONE (DELTASONE)  10 MG tablet TAKE 2 TABLETS (20mg ) BY MOUTH DAILY WITH BREAKFAST.   sertraline (ZOLOFT) 50 MG tablet Take 50 mg by mouth daily.   sulfamethoxazole-trimethoprim (BACTRIM DS) 800-160 MG tablet Take 1 tablet by mouth 3 (three) times a week.   isosorbide mononitrate (IMDUR) 60 MG 24 hr tablet Take 1 tablet (60 mg total) by mouth daily.   nicotine (NICODERM CQ - DOSED IN MG/24 HOURS) 21 mg/24hr patch Place 1 patch (21  mg total) onto the skin daily. (Patient not taking: Reported on 06/09/2022)   Varenicline Tartrate, Starter, (CHANTIX STARTING MONTH PAK) 0.5 MG X 11 & 1 MG X 42 TBPK Take 1 0.5 mg tablet once daily for 3 days, increase to 1 0.5 mg tablet twice daily for 4 days, increase to 1 1 mg tablet twice daily. (Patient not taking: Reported on 06/09/2022)   No facility-administered encounter medications on file as of 06/09/2022.    Physical Exam: Blood pressure 130/80, pulse 92, temperature 98.3 F (36.8 C), temperature source Oral, height 6\' 1"  (1.854 m), weight 199 lb 3.2 oz (90.4 kg), SpO2 95 %. Gen:      No acute distress HEENT:  EOMI, sclera anicteric Neck:     No masses; no thyromegaly Lungs:    Clear to auscultation bilaterally; normal respiratory effort CV:         Regular rate and rhythm; no murmurs Abd:      + bowel sounds; soft, non-tender; no palpable masses, no distension Ext:    No edema; adequate peripheral perfusion Skin:      Warm and dry; no rash Neuro: alert and oriented x 3 Psych: normal mood and affect   Data Reviewed: Imaging: CTA 10/19/2021-no evidence of pulmonary embolism.  Diffuse bilateral interstitial thickening with subpleural reticulation.  Diffuse bronchial wall thickening  High-resolution CT 12/14/2021-mild pulmonary fibrosis in apical to basal gradient with septal thickening, groundglass.  No evidence of traction bronchiectasis, bronchiolectasis or honeycombing.  Bronchial wall thickening.  Alternate diagnosis  CT head, spine 05/13/2022-left parietal scalp hematoma and laceration.  Cervical spondylosis, no other acute process. I have reviewed the images personally.  PFTs: 01/24/2022 FVC 3.36 [6 6%], FEV1 2.78 [72%], F/F 83, TLC 5.60 [75%], DLCO 17.02 [58%] Moderate restriction, diffusion defect  Labs: CMP 03/22/2022-within normal limits, CBC 3/90/24-WBC 15.1  Assessment:  RA-ILD CT reviewed with alternate pattern of interstitial changes with no significant traction  bronchiectasis or honeycombing.  This is an alternate pattern and is consistent with rheumatoid arthritis associated ILD.  On CellCept and is tolerating it well.  Will increase dose to 1500 mg twice daily Recent labs 2 weeks ago are within normal limits Has been off methotrexate for a couple of months He continues on prednisone at 20 mg a day.  I have advised him to follow-up with his rheumatologist to look for alternate medications for his joint symptoms. Continue Bactrim double strength 3 times a week  Will discuss with rheumatology as he wants to restart Rinvoq for arthritis symptoms.  Active smoker Discussed smoking cessation in detail.  He is ready to quit Prescribe nicotine patches and Chantix.  Time spent counseling-3 minutes.  Reassess at return visit.  Plan/Recommendations: Increase CellCept to 1.5 g twice daily Continue Bactrim Continue prednisone 20 mg/day Continue lab monitoring. Nicotine patches, Chantix  Chilton Greathouse MD [facility] 06/09/2022, 3:48 PM  CC: Stamey, Verda Cumins, FNP

## 2022-07-18 ENCOUNTER — Other Ambulatory Visit: Payer: Self-pay | Admitting: Pulmonary Disease

## 2022-07-19 ENCOUNTER — Other Ambulatory Visit: Payer: Self-pay | Admitting: Pulmonary Disease

## 2022-07-19 DIAGNOSIS — M0510 Rheumatoid lung disease with rheumatoid arthritis of unspecified site: Secondary | ICD-10-CM

## 2022-08-18 ENCOUNTER — Other Ambulatory Visit: Payer: Self-pay | Admitting: Internal Medicine

## 2022-08-24 ENCOUNTER — Other Ambulatory Visit: Payer: Self-pay | Admitting: Pulmonary Disease

## 2022-08-24 DIAGNOSIS — M0510 Rheumatoid lung disease with rheumatoid arthritis of unspecified site: Secondary | ICD-10-CM

## 2022-08-25 ENCOUNTER — Ambulatory Visit: Admitting: Pulmonary Disease

## 2022-08-26 ENCOUNTER — Other Ambulatory Visit: Payer: Self-pay | Admitting: Gastroenterology

## 2022-08-26 DIAGNOSIS — K529 Noninfective gastroenteritis and colitis, unspecified: Secondary | ICD-10-CM

## 2022-09-02 ENCOUNTER — Ambulatory Visit
Admission: RE | Admit: 2022-09-02 | Discharge: 2022-09-02 | Disposition: A | Discharge status: Home / Self Care | Source: Ambulatory Visit | Attending: Gastroenterology | Admitting: Gastroenterology

## 2022-09-02 DIAGNOSIS — K529 Noninfective gastroenteritis and colitis, unspecified: Secondary | ICD-10-CM

## 2022-09-02 MED ORDER — IOPAMIDOL (ISOVUE-300) INJECTION 61%
100.0000 mL | Freq: Once | INTRAVENOUS | Status: AC | PRN
  Administered 2022-09-02: 100 mL via INTRAVENOUS

## 2022-10-09 ENCOUNTER — Other Ambulatory Visit: Payer: Self-pay | Admitting: Pulmonary Disease

## 2022-10-09 DIAGNOSIS — M0510 Rheumatoid lung disease with rheumatoid arthritis of unspecified site: Secondary | ICD-10-CM

## 2022-10-10 ENCOUNTER — Ambulatory Visit (HOSPITAL_BASED_OUTPATIENT_CLINIC_OR_DEPARTMENT_OTHER)
Admission: RE | Admit: 2022-10-10 | Discharge: 2022-10-10 | Disposition: A | Discharge status: Home / Self Care | Source: Ambulatory Visit | Attending: Pulmonary Disease | Admitting: Pulmonary Disease

## 2022-10-10 DIAGNOSIS — M0510 Rheumatoid lung disease with rheumatoid arthritis of unspecified site: Secondary | ICD-10-CM | POA: Diagnosis present

## 2022-10-11 ENCOUNTER — Ambulatory Visit: Admitting: Pulmonary Disease

## 2022-10-11 DIAGNOSIS — M0510 Rheumatoid lung disease with rheumatoid arthritis of unspecified site: Secondary | ICD-10-CM

## 2022-10-11 LAB — PULMONARY FUNCTION TEST
DL/VA % pred: 81 %
DL/VA: 3.39 ml/min/mmHg/L
DLCO cor % pred: 60 %
DLCO cor: 17.45 ml/min/mmHg
DLCO unc % pred: 60 %
DLCO unc: 17.45 ml/min/mmHg
FEF 25-75 Post: 3.85 L/s
FEF 25-75 Pre: 2.98 L/s
FEF2575-%Change-Post: 29 %
FEF2575-%Pred-Post: 125 %
FEF2575-%Pred-Pre: 97 %
FEV1-%Change-Post: 5 %
FEV1-%Pred-Post: 79 %
FEV1-%Pred-Pre: 75 %
FEV1-Post: 3.01 L
FEV1-Pre: 2.86 L
FEV1FVC-%Change-Post: 2 %
FEV1FVC-%Pred-Pre: 107 %
FEV6-%Change-Post: 2 %
FEV6-%Pred-Post: 75 %
FEV6-%Pred-Pre: 73 %
FEV6-Post: 3.65 L
FEV6-Pre: 3.54 L
FEV6FVC-%Pred-Post: 104 %
FEV6FVC-%Pred-Pre: 104 %
FVC-%Change-Post: 2 %
FVC-%Pred-Post: 72 %
FVC-%Pred-Pre: 70 %
FVC-Post: 3.65 L
FVC-Pre: 3.54 L
Post FEV1/FVC ratio: 82 %
Post FEV6/FVC ratio: 100 %
Pre FEV1/FVC ratio: 81 %
Pre FEV6/FVC Ratio: 100 %
RV % pred: 83 %
RV: 1.99 L
TLC % pred: 73 %
TLC: 5.46 L

## 2022-10-11 NOTE — Progress Notes (Signed)
Full PFT performed today. °

## 2022-10-11 NOTE — Patient Instructions (Signed)
Full PFT performed today. °

## 2022-10-14 ENCOUNTER — Ambulatory Visit: Admitting: Pulmonary Disease

## 2022-10-18 ENCOUNTER — Ambulatory Visit: Admitting: Pulmonary Disease

## 2022-10-18 ENCOUNTER — Encounter: Payer: Self-pay | Admitting: Pulmonary Disease

## 2022-10-18 VITALS — BP 140/84 | HR 81 | Temp 98.3°F | Ht 73.0 in | Wt 183.0 lb

## 2022-10-18 DIAGNOSIS — J849 Interstitial pulmonary disease, unspecified: Secondary | ICD-10-CM

## 2022-10-18 DIAGNOSIS — Z5181 Encounter for therapeutic drug level monitoring: Secondary | ICD-10-CM

## 2022-10-18 LAB — CBC WITH DIFFERENTIAL/PLATELET
Basophils Absolute: 0.1 10*3/uL (ref 0.0–0.1)
Basophils Relative: 0.4 % (ref 0.0–3.0)
Eosinophils Absolute: 0.5 10*3/uL (ref 0.0–0.7)
Eosinophils Relative: 4.1 % (ref 0.0–5.0)
HCT: 45.1 % (ref 39.0–52.0)
Hemoglobin: 14.7 g/dL (ref 13.0–17.0)
Lymphocytes Relative: 8.5 % — ABNORMAL LOW (ref 12.0–46.0)
Lymphs Abs: 1 10*3/uL (ref 0.7–4.0)
MCHC: 32.6 g/dL (ref 30.0–36.0)
MCV: 103.1 fL — ABNORMAL HIGH (ref 78.0–100.0)
Monocytes Absolute: 0.8 10*3/uL (ref 0.1–1.0)
Monocytes Relative: 7 % (ref 3.0–12.0)
Neutro Abs: 9.6 10*3/uL — ABNORMAL HIGH (ref 1.4–7.7)
Neutrophils Relative %: 80 % — ABNORMAL HIGH (ref 43.0–77.0)
Platelets: 305 10*3/uL (ref 150.0–400.0)
RBC: 4.37 Mil/uL (ref 4.22–5.81)
RDW: 14.5 % (ref 11.5–15.5)
WBC: 12 10*3/uL — ABNORMAL HIGH (ref 4.0–10.5)

## 2022-10-18 LAB — COMPREHENSIVE METABOLIC PANEL
ALT: 22 U/L (ref 0–53)
AST: 25 U/L (ref 0–37)
Albumin: 3.8 g/dL (ref 3.5–5.2)
Alkaline Phosphatase: 167 U/L — ABNORMAL HIGH (ref 39–117)
BUN: 8 mg/dL (ref 6–23)
CO2: 28 meq/L (ref 19–32)
Calcium: 9.4 mg/dL (ref 8.4–10.5)
Chloride: 102 meq/L (ref 96–112)
Creatinine, Ser: 0.64 mg/dL (ref 0.40–1.50)
GFR: 101.47 mL/min (ref >60.00)
Glucose, Bld: 87 mg/dL (ref 70–99)
Potassium: 3.9 meq/L (ref 3.5–5.1)
Sodium: 135 meq/L (ref 135–145)
Total Bilirubin: 0.7 mg/dL (ref 0.2–1.2)
Total Protein: 7.2 g/dL (ref 6.0–8.3)

## 2022-10-18 NOTE — Patient Instructions (Signed)
VISIT SUMMARY:  During your visit, we discussed your rheumatoid arthritis and interstitial lung disease, which are currently stable. We also talked about your struggle with quitting smoking and the issues you've been having with your knee. You mentioned experiencing thinning skin, weight loss, and difficulty eating, which we will continue to monitor.  YOUR PLAN:  -RHEUMATOID ARTHRITIS AND INTERSTITIAL LUNG DISEASE: These are conditions where your immune system mistakenly attacks your own body's tissues, causing inflammation in your joints and lungs. We will continue your current medication, Mycophenolate, and will work with your rheumatologist to get approval for Rinvoq, a different medication that might help manage your symptoms better.  -TOBACCO USE: Smoking can worsen your lung disease and other health problems. We discussed resources to help you quit, including the [place] cessation helpline. 1-800-QUIT-NOW 936-008-6485);  INSTRUCTIONS:  We will check your liver function and blood counts today. If Rinvoq gets approved by your insurance, we may consider reducing your Prednisone dosage. Please follow up in 6 months.

## 2022-10-18 NOTE — Progress Notes (Signed)
Fernando Lloyd    [id number]    July 02, 1960  Primary Care Physician:Stamey, Verda Cumins, FNP  Referring Physician: Genia Hotter, FNP [address] [place],  [place] [address]  Chief complaint: Follow-up for interstitial lung disease, RA ILD Started on Cellcept January 2024  HPI: 62 y.o. who  has a past medical history of Anxiety, Ataxia, Hyperlipidemia, Hypertension, Myocardial infarct (HCC) (11/23/2014), and Rheumatoid arthritis (HCC).   Chief complaint is dyspnea on exertion for many months.  He noticed some blood in the sputum 1 to 2 months.  It was a single isolated episode which did not require.  Ago.  Also has generalized weakness, diarrhea for the past 2 months.  Denies any cough, fevers, chills. Has history of rheumatoid arthritis for the past 5 years.  He follows with [facility] rheumatology and was previously on Humira and Rinvoq.  This was too expensive from the insurance and was switched to prednisone 5 mg a day, methotrexate and Arava.  He states that his joint symptoms are well-controlled  Received and reviewed office note from [facility] rheumatology dated 04/21/2022 Patient is currently off methotrexate and is continuing leflunomide. Harriette Ohara worked before but he was on methotrexate due to Careers information officer.  If his rheumatoid arthritis symptoms worsen off methotrexate then they can appeal Harriette Ohara now that he is already tried methotrexate as preferred therapy.  Started on CellCept in Jan 2024 and he is tolerating it well.  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  No feather pillows or comforters ILD questionnaire 01/26/2022-negative Smoking history: 1 pack/day for 42 years.  Continues to smoke Travel history: Previously lived in [place].  No significant recent  travel Relevant family history: No family history of lung disease  Interim history:  Discussed the use of AI scribe software for clinical note transcription with the patient, who gave verbal consent to proceed.  The patient, with a history of rheumatoid arthritis and interstitial lung disease, reports no major change in his breathing since the last visit. He is currently on mycophenolate (Cellcept), taking three tablets in the morning and three in the evening. He also has rheumatoid arthritis, for which he is taking prednisone, approximately 20mg  daily. He reports feeling good with his arthritis symptoms. He has been following up with [facility]umatology and is hoping to switch to Rinvoq if it gets approved by his insurance.  The patient also reports thinning skin, weight loss, and difficulty eating. He has a history of smoking and is trying to quit, but has had trouble getting cessation aids approved by his insurance. He also reports issues with his knee giving out, leading to falls.   Outpatient Encounter Medications as of 10/18/2022  Medication Sig   albuterol (PROVENTIL) (2.5 MG/3ML) 0.083% nebulizer solution Take 3 mLs (2.5 mg total) by nebulization every 4 (four) hours as needed.   albuterol (VENTOLIN HFA) 108 (90 Base) MCG/ACT inhaler INHALE 2 PUFFS BY MOUTH EVERY 4 HOURS AS NEEDED FOR WHEEZE OR FOR SHORTNESS OF BREATH   ALPRAZolam (XANAX) 0.25 MG tablet Take 0.25 mg by mouth See admin instructions. Take 0.25 mg by mouth one to two times a day as needed for anxiety   Budeson-Glycopyrrol-Formoterol (BREZTRI AEROSPHERE) 160-9-4.8 MCG/ACT AERO Inhale 2 puffs into the lungs 2 (two) times daily.  famotidine (PEPCID) 20 MG tablet One twice daily   folic acid (FOLVITE) 1 MG tablet 1 tablet Orally Once a day for 60 days   leflunomide (ARAVA) 20 MG tablet Take 20 mg by mouth daily.   mycophenolate (CELLCEPT) 500 MG tablet Take 2 tablets (1,000 mg total) by mouth 2 (two) times daily.    mycophenolate (CELLCEPT) 500 MG tablet Take 1500mg  twice daily   pantoprazole (PROTONIX) 40 MG tablet TAKE 1 TABLET (40 MG TOTAL) BY MOUTH DAILY. TAKE 30-60 MIN BEFORE FIRST MEAL OF THE DAY   predniSONE (DELTASONE) 10 MG tablet TAKE 2 TABLETS BY MOUTH DAILY WITH BREAKFAST   sertraline (ZOLOFT) 50 MG tablet Take 50 mg by mouth daily.   isosorbide mononitrate (IMDUR) 60 MG 24 hr tablet Take 1 tablet (60 mg total) by mouth daily.   nicotine (NICODERM CQ - DOSED IN MG/24 HOURS) 21 mg/24hr patch Place 1 patch (21 mg total) onto the skin daily. (Patient not taking: Reported on 10/18/2022)   sulfamethoxazole-trimethoprim (BACTRIM DS) 800-160 MG tablet Take 1 tablet by mouth 3 (three) times a week. (Patient not taking: Reported on 10/18/2022)   Varenicline Tartrate, Starter, (CHANTIX STARTING MONTH PAK) 0.5 MG X 11 & 1 MG X 42 TBPK Take 1 0.5 mg tablet once daily for 3 days, increase to 1 0.5 mg tablet twice daily for 4 days, increase to 1 1 mg tablet twice daily. (Patient not taking: Reported on 10/18/2022)   No facility-administered encounter medications on file as of 10/18/2022.    Physical Exam: Blood pressure (!) 140/84, pulse 81, temperature 98.3 F (36.8 C), temperature source Oral, height 6\' 1"  (1.854 m), weight 183 lb (83 kg), SpO2 96%. Gen:      No acute distress HEENT:  EOMI, sclera anicteric Neck:     No masses; no thyromegaly Lungs:    Clear to auscultation bilaterally; normal respiratory effort CV:         Regular rate and rhythm; no murmurs Abd:      + bowel sounds; soft, non-tender; no palpable masses, no distension Ext:    No edema; adequate peripheral perfusion Skin:      Warm and dry; no rash Neuro: alert and oriented x 3 Psych: normal mood and affect   Data Reviewed: Imaging: CTA 10/19/2021-no evidence of pulmonary embolism.  Diffuse bilateral interstitial thickening with subpleural reticulation.  Diffuse bronchial wall thickening  High-resolution CT 12/14/2021-mild  pulmonary fibrosis in apical to basal gradient with septal thickening, groundglass.  No evidence of traction bronchiectasis, bronchiolectasis or honeycombing.  Bronchial wall thickening.  Alternate diagnosis  High resolution CT 10/13/2022-radiology read is pending.  By my review there appears to be less of groundglass inflammation and stable fibrosis I have reviewed the images personally.  PFTs: 01/24/2022 FVC 3.36 [6 6%], FEV1 2.78 [72%], F/F 83, TLC 5.60 [75%], DLCO 17.02 [58%] Moderate restriction, diffusion defect  10/11/2022 FVC 3.65 [72%], FEV1 3.01 [79%], F/F82, TLC 5.46 [93%], DLCO 17.45 [60%]   Labs: CMP 03/22/2022-within normal limits, CBC 3/90/24-WBC 15.1  Assessment:  Rheumatoid Arthritis (RA) associated Interstitial Lung Disease (ILD) CT reviewed with alternate pattern of interstitial changes with no significant traction bronchiectasis or honeycombing.  This is an alternate pattern and is consistent with rheumatoid arthritis associated ILD.  On CellCept and is tolerating it well.   Has been off methotrexate since early 2024 He continues on prednisone at 20 mg a day.  I have advised him to follow-up with his rheumatologist to look for alternate medications for  his joint symptoms.   Continue Bactrim double strength 3 times a week  -Continue Mycophenolate 1.5 gm bid -Check liver function tests and blood counts today. -Communicate with Rheumatology to facilitate approval of Rinvoq.  Rheumatoid Arthritis Patient currently on Prednisone 20mg  daily (prescribed 40mg  daily) due to insurance issues with Rinvoq. Patient experiencing thinning skin and weight loss. -Communicate with Rheumatology to facilitate approval of Rinvoq. -Consider tapering Prednisone if Rinvoq is approved.  Tobacco Use Patient struggling with smoking cessation. Previous attempts to get Chantix and nicotine replacement therapy were not approved by insurance. -Provide information for [employer] cessation  helpline for potential resources.   Plan/Recommendations: Continue CellCept, prednisone Labs for monitoring Communicate with rheumatology  Chilton Greathouse MD [facility] 10/18/2022, 11:03 AM  CC: Stamey, Verda Cumins, FNP

## 2022-12-14 ENCOUNTER — Other Ambulatory Visit: Payer: Self-pay | Admitting: Internal Medicine

## 2022-12-14 ENCOUNTER — Other Ambulatory Visit: Payer: Self-pay | Admitting: Pulmonary Disease

## 2023-03-21 ENCOUNTER — Other Ambulatory Visit: Payer: Self-pay | Admitting: Pulmonary Disease

## 2023-03-21 DIAGNOSIS — M0510 Rheumatoid lung disease with rheumatoid arthritis of unspecified site: Secondary | ICD-10-CM

## 2023-03-23 ENCOUNTER — Other Ambulatory Visit: Payer: Self-pay | Admitting: Pulmonary Disease

## 2023-03-23 DIAGNOSIS — M0510 Rheumatoid lung disease with rheumatoid arthritis of unspecified site: Secondary | ICD-10-CM

## 2023-04-04 ENCOUNTER — Encounter: Payer: Self-pay | Admitting: Pulmonary Disease

## 2023-04-04 DIAGNOSIS — R29898 Other symptoms and signs involving the musculoskeletal system: Secondary | ICD-10-CM

## 2023-04-11 NOTE — Telephone Encounter (Signed)
 I called and discussed with patient.  It appears that his symptoms may be from spinal stenosis and nerve compression.  He is already seen an orthopedic doctor in the past who told him that there is nothing much they can do for his spinal stenosis.  He is requesting evaluation by neurology  Please make referral to neurology for difficulty walking, leg weakness

## 2023-04-12 NOTE — Telephone Encounter (Signed)
 Referral has been placed.

## 2023-05-11 ENCOUNTER — Encounter: Payer: Self-pay | Admitting: Neurology

## 2023-05-18 ENCOUNTER — Encounter: Payer: Self-pay | Admitting: Neurology

## 2023-05-18 ENCOUNTER — Ambulatory Visit: Admitting: Neurology

## 2023-05-18 VITALS — BP 168/89 | HR 113 | Ht 73.0 in | Wt 190.6 lb

## 2023-05-18 DIAGNOSIS — R159 Full incontinence of feces: Secondary | ICD-10-CM | POA: Diagnosis not present

## 2023-05-18 DIAGNOSIS — R269 Unspecified abnormalities of gait and mobility: Secondary | ICD-10-CM | POA: Diagnosis not present

## 2023-05-18 MED ORDER — GABAPENTIN 300 MG PO CAPS: 300.0000 mg | ORAL_CAPSULE | Freq: Three times a day (TID) | ORAL | 11 refills | Status: AC

## 2023-05-18 NOTE — Progress Notes (Signed)
 Chief Complaint  Patient presents with   New Patient (Initial Visit)    Rm16, wife present, referral for Balance problems: gait issues, walks on side of feet, frequent falls, issues w/looking up and forward while walking. Right sided weakness but goes to PT for right leg.       ASSESSMENT AND PLAN  Fernando Lloyd is a 63 y.o. 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   MEDICAL HISTORY:  Fernando Lloyd is a 63 year old male accompanied by his wife, seen in request by his primary care from [facility] nurse practitioner Stamey, Alisa App for evaluation of gait abnormality, initial evaluation May 18, 2023    History is obtained from the patient and review of electronic medical records. I personally reviewed pertinent available imaging films in PACS.   PMHx of  Interstitial lung disease, pulmonary fibrosis, on prednisone , cellcept  since 2023 Smoke 1ppd Alcohol more than 6 beers a day. CAD Rheumatoid arthritis, multiple joints pain,  Stroke,   I saw him in November 2020 for acute onset of dizziness, unsteady gait, MRI of the brain then showed no acute abnormality, evidence of mild to moderate small vessel disease, chronic lacunar infarction in the left and mid pons MRA of head and neck showed 20% stenosis of right internal carotid artery, otherwise no hemodynamic large vessel disease  He developed slow  worsening shortness of breath with exertion, CT chest in December 2023 confirmed pulmonary fibrosis in the past him with apical to basal gradient, featuring irregular peripheral and peri-bronchovascular interstitial opacity, septal thickening, groundglass, diffuse bilateral bronchial wall thickening,  He was eventually diagnosed with interstitial lung disease, pulmonary fibrosis, has been treated with prednisone  and CellCept   He has increased difficulty, 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  Lab in March 2025: Hg 13.5,  creat 0.7,  ALP 140,   PHYSICAL EXAM:   Vitals:   05/18/23 0932 05/18/23 0936  BP: (!) 169/83 (!) 168/89  Pulse:  (!) 113  Weight:  190 lb 9.6 oz (86.5 kg)  Height:  6\' 1"  (1.854 m)   Body mass index is 25.15 kg/m.  PHYSICAL EXAMNIATION:  Gen: NAD, conversant, well nourised, well groomed                     Cardiovascular: Regular rate rhythm, no peripheral edema, warm, nontender. Eyes: Conjunctivae clear without exudates or hemorrhage Neck: Supple, no carotid bruits. Pulmonary: Clear to auscultation bilaterall  NEUROLOGICAL EXAM:  MENTAL STATUS: Speech/cognition: Awake, alert, oriented to history taking and casual conversation CRANIAL NERVES: CN II: Visual  fields are full to confrontation. Pupils are round equal and briskly reactive to light. CN III, IV, VI: extraocular movement are normal.  Left static ptosis, had evidence of left temporal region skin cancer  removal CN V: Facial sensation is intact to light touch CN VII: Face is symmetric with normal eye closure  CN VIII: Hearing is normal to causal conversation. CN IX, X: Phonation is normal. CN XI: Head turning and shoulder shrug are intact  MOTOR: Right anterior thigh atrophy, R/L, hip flexion 4/5, knee extension 4/5, knee flexion 5-/5, ankle dorsiflexion4/4, ankle plantarflexion5-/5-  Upper extremity motor strength was normal  REFLEXES: Reflexes are 2+ and symmetric at the biceps, triceps, knees (2/L2), and absent at ankles. Plantar responses are extensor bilaterally  SENSORY: Mildly length-dependent decreased light touch vibratory sensation, preserved toe proprioception  COORDINATION: There is no trunk or limb dysmetria noted.  GAIT/STANCE: Push-up to get up from seated position, dragging right leg  REVIEW OF SYSTEMS:  Full 14 system review of systems performed and notable only for as above All other review of systems were negative.   ALLERGIES: Allergies  Allergen Reactions   Other     ROSES (flower)    Other Reaction(s): Sneezing and itchiness  ROSES (flower)   Other Reaction(s): Sneezing and itchiness   Penicillin V Potassium     Other Reaction(s): Unknown    HOME MEDICATIONS: Current Outpatient Medications  Medication Sig Dispense Refill   albuterol  (PROVENTIL ) (2.5 MG/3ML) 0.083% nebulizer solution Take 3 mLs (2.5 mg total) by nebulization every 4 (four) hours as needed. 75 mL 12   albuterol  (VENTOLIN  HFA) 108 (90 Base) MCG/ACT inhaler INHALE 2 PUFFS BY MOUTH EVERY 4 HOURS AS NEEDED FOR WHEEZE OR FOR SHORTNESS OF BREATH 18 each 2   ALPRAZolam (XANAX) 0.25 MG tablet Take 0.25 mg by mouth See admin instructions. Take 0.25 mg by mouth one to two times a day as needed for anxiety     Budeson-Glycopyrrol-Formoterol (BREZTRI  AEROSPHERE) 160-9-4.8 MCG/ACT AERO Inhale 2 puffs into the lungs 2 (two) times daily. 10.7 g 11   famotidine  (PEPCID ) 20 MG tablet ONE AFTER SUPPER  90 tablet 3   folic acid (FOLVITE) 1 MG tablet 1 tablet Orally Once a day for 60 days     isosorbide  mononitrate (IMDUR ) 60 MG 24 hr tablet Take 1 tablet (60 mg total) by mouth daily. 30 tablet 0   leflunomide (ARAVA) 20 MG tablet Take 20 mg by mouth daily.     mycophenolate  (CELLCEPT ) 500 MG tablet TAKE 1500MG  TWICE DAILY 540 tablet 1   mycophenolate  (CELLCEPT ) 500 MG tablet TAKE 2 TABLETS BY MOUTH TWICE A DAY 360 tablet 1   nicotine  (NICODERM CQ  - DOSED IN MG/24 HOURS) 21 mg/24hr patch Place 1 patch (21 mg total) onto the skin daily. 28 patch 0   pantoprazole  (PROTONIX ) 40 MG tablet TAKE 1 TABLET (40 MG TOTAL) BY MOUTH DAILY. TAKE 30-60 MIN BEFORE FIRST MEAL OF THE DAY 90 tablet 3   predniSONE  (DELTASONE ) 10 MG tablet TAKE 2 TABLETS BY MOUTH DAILY WITH BREAKFAST 60 tablet 1   sertraline (ZOLOFT) 50 MG tablet Take 50 mg by mouth daily.     sulfamethoxazole -trimethoprim  (BACTRIM  DS) 800-160 MG tablet Take 1 tablet by mouth 3 (three) times a week. 12 tablet 5   Varenicline  Tartrate, Starter, (CHANTIX  STARTING MONTH PAK) 0.5 MG X 11 & 1 MG X 42 TBPK Take 1 0.5 mg tablet once daily for 3 days, increase to 1 0.5 mg tablet twice  daily for 4 days, increase to 1 1 mg tablet twice daily. 53 each 0   No current facility-administered medications for this visit.    PAST MEDICAL HISTORY: Past Medical History:  Diagnosis Date   Anxiety    Ataxia    Hyperlipidemia    Hypertension    Myocardial infarct (HCC) 11/23/2014   RCA stent 3.5x23 DES in [place]   Rheumatoid arthritis (HCC)     PAST SURGICAL HISTORY: Past Surgical History:  Procedure Laterality Date   CARDIAC CATHETERIZATION N/A 11/23/2014   CARPAL TUNNEL RELEASE     CORONARY ANGIOPLASTY     2016   CORONARY ANGIOPLASTY WITH STENT PLACEMENT      FAMILY HISTORY: Family History  Problem Relation Age of Onset   Dementia Mother    Breast cancer Mother    Other Father        unsure of history    SOCIAL HISTORY: Social History    Socioeconomic History   Marital status: Married    Spouse name: Not on file   Number of children: 1   Years of education: some college   Highest education level: Not on file  Occupational History   Not on file  Tobacco Use   Smoking status: Every Day    Current packs/day: 1.00    Average packs/day: 1 pack/day for 43.0 years (43.0 ttl pk-yrs)    Types: Cigarettes   Smokeless tobacco: Never   Tobacco comments:    1- 1.5 packs daily 06/09/22/lt  Vaping Use   Vaping status: Never Used  Substance and Sexual Activity   Alcohol use: Yes    Alcohol/week: 12.0 standard drinks of alcohol    Types: 12 Cans of beer per week   Drug use: Never   Sexual activity: Not on file  Other Topics Concern   Not on file  Social History Narrative   Lives at home with his wife.   Right-handed.   1-2 cups per day.   Social Drivers of Corporate investment banker Strain: Not on file  Food Insecurity: Not on file  Transportation Needs: Not on file  Physical Activity: Not on file  Stress: Not on file  Social Connections: Not on file  Intimate Partner Violence: Not on file      Phebe Brasil, M.D. Ph.D.  [facility] [address] [place], [place] [address] Ph: [phone] Fax: [phone]  CC:  Stamey, Lowell Rude, FNP [address] [place],  [place] [address]  Stamey, Lowell Rude, FNP

## 2023-05-24 ENCOUNTER — Telehealth: Payer: Self-pay | Admitting: Neurology

## 2023-05-24 NOTE — Telephone Encounter (Signed)
 no auth required sent to [employer] [phone]

## 2023-06-04 ENCOUNTER — Other Ambulatory Visit: Payer: Self-pay | Admitting: Pulmonary Disease

## 2023-06-04 DIAGNOSIS — M0510 Rheumatoid lung disease with rheumatoid arthritis of unspecified site: Secondary | ICD-10-CM

## 2023-06-08 ENCOUNTER — Ambulatory Visit (HOSPITAL_BASED_OUTPATIENT_CLINIC_OR_DEPARTMENT_OTHER)
Admission: RE | Admit: 2023-06-08 | Discharge: 2023-06-08 | Disposition: A | Discharge status: Home / Self Care | Source: Ambulatory Visit | Attending: Neurology | Admitting: Neurology

## 2023-06-08 DIAGNOSIS — R269 Unspecified abnormalities of gait and mobility: Secondary | ICD-10-CM

## 2023-06-08 DIAGNOSIS — R159 Full incontinence of feces: Secondary | ICD-10-CM

## 2023-06-20 ENCOUNTER — Encounter: Payer: Self-pay | Admitting: Neurology

## 2023-06-20 DIAGNOSIS — R269 Unspecified abnormalities of gait and mobility: Secondary | ICD-10-CM

## 2023-06-20 DIAGNOSIS — R159 Full incontinence of feces: Secondary | ICD-10-CM

## 2023-06-26 NOTE — Telephone Encounter (Signed)
 Lease call patient MRI of cervical and lumbar spine showed multilevel degenerative changes, at variable degree, but there was no evidence of spinal cord compression to explain his current symptoms  I have ordered MRI of brain and thoracic spine w/wo for further evaluation   1. No acute findings or clear explanation for the patient's symptoms. 2. Multilevel cervical spondylosis as described. There is mild spinal stenosis at C3-4, C4-5 and C5-6. No abnormal cord signal. 3. Multilevel foraminal narrowing, most advanced on the left at C3-4. 4. Interbody ankylosis at C5-6.   IMPRESSION: 1. Chronic degenerative disc disease and facet arthrosis at L3-4, with mild-to-moderate central spinal canal stenosis, moderate left lateral recess stenosis and questionable impingement of the left L4 nerve.

## 2023-06-28 NOTE — Telephone Encounter (Signed)
 no auth required sent to [employer] [phone]

## 2023-06-29 ENCOUNTER — Ambulatory Visit: Admitting: Pulmonary Disease

## 2023-06-29 ENCOUNTER — Encounter: Payer: Self-pay | Admitting: Pulmonary Disease

## 2023-06-29 VITALS — BP 132/80 | HR 86 | Ht 73.0 in | Wt 193.4 lb

## 2023-06-29 DIAGNOSIS — J849 Interstitial pulmonary disease, unspecified: Secondary | ICD-10-CM | POA: Diagnosis not present

## 2023-06-29 DIAGNOSIS — Z5181 Encounter for therapeutic drug level monitoring: Secondary | ICD-10-CM | POA: Diagnosis not present

## 2023-06-29 LAB — COMPREHENSIVE METABOLIC PANEL WITH GFR
ALT: 23 U/L (ref 0–53)
AST: 29 U/L (ref 0–37)
Albumin: 3.8 g/dL (ref 3.5–5.2)
Alkaline Phosphatase: 126 U/L — ABNORMAL HIGH (ref 39–117)
BUN: 17 mg/dL (ref 6–23)
CO2: 24 meq/L (ref 19–32)
Calcium: 9.3 mg/dL (ref 8.4–10.5)
Chloride: 100 meq/L (ref 96–112)
Creatinine, Ser: 0.73 mg/dL (ref 0.40–1.50)
GFR: 97.04 mL/min (ref >60.00)
Glucose, Bld: 94 mg/dL (ref 70–99)
Potassium: 3.7 meq/L (ref 3.5–5.1)
Sodium: 133 meq/L — ABNORMAL LOW (ref 135–145)
Total Bilirubin: 0.6 mg/dL (ref 0.2–1.2)
Total Protein: 7 g/dL (ref 6.0–8.3)

## 2023-06-29 LAB — CBC WITH DIFFERENTIAL/PLATELET
Basophils Absolute: 0 10*3/uL (ref 0.0–0.1)
Basophils Relative: 0.2 % (ref 0.0–3.0)
Eosinophils Absolute: 0.1 10*3/uL (ref 0.0–0.7)
Eosinophils Relative: 0.5 % (ref 0.0–5.0)
HCT: 41.3 % (ref 39.0–52.0)
Hemoglobin: 13.6 g/dL (ref 13.0–17.0)
Lymphocytes Relative: 6.8 % — ABNORMAL LOW (ref 12.0–46.0)
Lymphs Abs: 1 10*3/uL (ref 0.7–4.0)
MCHC: 33 g/dL (ref 30.0–36.0)
MCV: 92.6 fl (ref 78.0–100.0)
Monocytes Absolute: 0.8 10*3/uL (ref 0.1–1.0)
Monocytes Relative: 5.7 % (ref 3.0–12.0)
Neutro Abs: 12.3 10*3/uL — ABNORMAL HIGH (ref 1.4–7.7)
Neutrophils Relative %: 86.8 % — ABNORMAL HIGH (ref 43.0–77.0)
Platelets: 303 10*3/uL (ref 150.0–400.0)
RBC: 4.46 Mil/uL (ref 4.22–5.81)
RDW: 14.3 % (ref 11.5–15.5)
WBC: 14.2 10*3/uL — ABNORMAL HIGH (ref 4.0–10.5)

## 2023-06-29 MED ORDER — NICOTINE 14 MG/24HR TD PT24: 14.0000 mg | MEDICATED_PATCH | Freq: Every day | TRANSDERMAL | 0 refills | Status: AC

## 2023-06-29 MED ORDER — BUPROPION HCL ER (SR) 150 MG PO TB12: 150.0000 mg | ORAL_TABLET | Freq: Every day | ORAL | 1 refills | Status: DC

## 2023-06-29 NOTE — Progress Notes (Signed)
 Fernando Lloyd    [id number]    1960/04/01  Primary Care Physician:Stamey, Chiquita POUR, FNP  Referring Physician: Crecencio Chiquita POUR, FNP [address] [place],  [place] [address]  Chief complaint: Follow-up for interstitial lung disease, RA ILD Started on Cellcept  January 2024  HPI: 63 y.o. who  has a past medical history of Anxiety, Ataxia, Hyperlipidemia, Hypertension, Myocardial infarct (HCC) (11/23/2014), and Rheumatoid arthritis (HCC).   Chief complaint is dyspnea on exertion for many months.  He noticed some blood in the sputum 1 to 2 months.  It was a single isolated episode which did not require.  Ago.  Also has generalized weakness, diarrhea for the past 2 months.  Denies any cough, fevers, chills. Has history of rheumatoid arthritis for the past 5 years.  He follows with [facility] rheumatology and was previously on Humira and Rinvoq.  This was too expensive from the insurance and was switched to prednisone  5 mg a day, methotrexate and Arava.  He states that his joint symptoms are well-controlled  Received and reviewed office note from [facility] rheumatology dated 04/21/2022 Patient is currently off methotrexate and is continuing leflunomide. Earma worked before but he was on methotrexate due to Careers information officer.  If his rheumatoid arthritis symptoms worsen off methotrexate then they can appeal Earma now that he is already tried methotrexate as preferred therapy.  Started on CellCept  in Jan 2024 and he is tolerating it well.  Dose increased to 1500 mg twice daily on June 2024  Interim History: Discussed the use of AI scribe software for clinical note transcription with the patient, who gave verbal consent to proceed.  History of Present Illness Fernando Lloyd is a 63 year old male with rheumatoid arthritis and interstitial lung disease who presents for follow-up of his pulmonary condition.  He is managing his rheumatoid arthritis with  CellCept  (mycophenolate  mofetil) at 1500 mg twice daily, leflunomide, and prednisone  at 20 mg daily, though he occasionally adjusts the dose based on symptoms. Prednisone  alleviates pain and stiffness but has caused significant side effects, including damage to his arms. He also takes Bactrim  due to his prednisone  use.  He has interstitial lung disease secondary to rheumatoid arthritis and is on CellCept  for this condition. Despite treatment, he has gained approximately 20 pounds recently and continues to experience congestion.  He experiences neuropathy and has undergone a CT scan and MRI, which revealed spinal stenosis in the lumbar region. He reports right leg weakness and bowel and bladder incontinence. An MRI of the brain is scheduled for further evaluation.  He has a history of elevated liver enzymes, which were initially high but normalized on repeat testing.  He smokes up to a pack of cigarettes a day and wants to quit, having previously tried nicotine  patches and Chantix  without success. He is currently taking medication for depression, including a medication starting with 'S' and another at 150 mg, but feels it is not fully effective.   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  No feather pillows or comforters ILD questionnaire 01/26/2022-negative Smoking history: 1 pack/day for 42 years.  Continues to smoke Travel history: Previously lived in [place] .  No significant recent travel Relevant family history: No family history of lung disease  Outpatient Encounter Medications as of 06/29/2023  Medication Sig   albuterol  (PROVENTIL ) (2.5 MG/3ML) 0.083% nebulizer  solution Take 3 mLs (2.5 mg total) by nebulization every 4 (four) hours as needed.   albuterol  (VENTOLIN  HFA) 108 (90 Base) MCG/ACT inhaler INHALE 2 PUFFS BY MOUTH EVERY 4 HOURS AS NEEDED FOR WHEEZE OR  FOR SHORTNESS OF BREATH   ALPRAZolam (XANAX) 0.25 MG tablet Take 0.25 mg by mouth See admin instructions. Take 0.25 mg by mouth one to two times a day as needed for anxiety   Budeson-Glycopyrrol-Formoterol (BREZTRI  AEROSPHERE) 160-9-4.8 MCG/ACT AERO Inhale 2 puffs into the lungs 2 (two) times daily.   famotidine  (PEPCID ) 20 MG tablet ONE AFTER SUPPER   folic acid (FOLVITE) 1 MG tablet 1 tablet Orally Once a day for 60 days   gabapentin  (NEURONTIN ) 300 MG capsule Take 1 capsule (300 mg total) by mouth 3 (three) times daily.   isosorbide  mononitrate (IMDUR ) 60 MG 24 hr tablet Take 1 tablet (60 mg total) by mouth daily.   leflunomide (ARAVA) 20 MG tablet Take 20 mg by mouth daily.   mycophenolate  (CELLCEPT ) 500 MG tablet TAKE 1500MG  TWICE DAILY   mycophenolate  (CELLCEPT ) 500 MG tablet TAKE 2 TABLETS BY MOUTH TWICE A DAY   nicotine  (NICODERM CQ  - DOSED IN MG/24 HOURS) 21 mg/24hr patch Place 1 patch (21 mg total) onto the skin daily.   pantoprazole  (PROTONIX ) 40 MG tablet TAKE 1 TABLET (40 MG TOTAL) BY MOUTH DAILY. TAKE 30-60 MIN BEFORE FIRST MEAL OF THE DAY   predniSONE  (DELTASONE ) 10 MG tablet TAKE 2 TABLETS BY MOUTH DAILY WITH BREAKFAST   sulfamethoxazole -trimethoprim  (BACTRIM  DS) 800-160 MG tablet Take 1 tablet by mouth 3 (three) times a week.   Varenicline  Tartrate, Starter, (CHANTIX  STARTING MONTH PAK) 0.5 MG X 11 & 1 MG X 42 TBPK Take 1 0.5 mg tablet once daily for 3 days, increase to 1 0.5 mg tablet twice daily for 4 days, increase to 1 1 mg tablet twice daily.   No facility-administered encounter medications on file as of 06/29/2023.    Physical Exam: Blood pressure 132/80, pulse 86, height 6' 1 (1.854 m), weight 193 lb 6.4 oz (87.7 kg), SpO2 94%. Gen:      No acute distress HEENT:  EOMI, sclera anicteric Neck:     No masses; no thyromegaly Lungs:    Clear to auscultation bilaterally; normal respiratory effort CV:         Regular rate and rhythm; no murmurs Abd:      + bowel sounds;  soft, non-tender; no palpable masses, no distension Ext:    No edema; adequate peripheral perfusion Neuro: alert and oriented x 3 Psych: normal mood and affect   Data Reviewed: Imaging: CTA 10/19/2021-no evidence of pulmonary embolism.  Diffuse bilateral interstitial thickening with subpleural reticulation.  Diffuse bronchial wall thickening  High-resolution CT 12/14/2021-mild pulmonary fibrosis in apical to basal gradient with septal thickening, groundglass.  No evidence of traction bronchiectasis, bronchiolectasis or honeycombing.  Bronchial wall thickening.  Alternate diagnosis  High resolution CT 10/13/2022-stable pattern of pulmonary fibrosis  I have reviewed the images personally.  PFTs: 01/24/2022 FVC 3.36 [6 6%], FEV1 2.78 [72%], F/F 83, TLC 5.60 [75%], DLCO 17.02 [58%] Moderate restriction, diffusion defect  10/11/2022 FVC 3.65 [72%], FEV1 3.01 [79%], F/F82, TLC 5.46 [93%], DLCO 17.45 [60%]   Labs: CMP 03/22/2022-within normal limits, CBC 3/90/24-WBC 15.1 Assessment & Plan Rheumatoid arthritis with interstitial lung disease CT reviewed with alternate pattern of interstitial changes with no significant traction bronchiectasis or honeycombing.  This is an alternate pattern and is consistent with rheumatoid arthritis associated  ILD.  On CellCept  and is tolerating it well.   Has been off methotrexate since early 2024 He continues on prednisone  at 20 mg a day.   Prednisone  alleviates RA pain and stiffness, but he is concerned about side effects, particularly on his arms. Interstitial lung disease remains stable per previous imaging. Follow-up with rheumatology is needed regarding Rinvoq, previously denied by insurance, but reconsideration is possible due to the interstitial lung disease diagnosis. - Continue CellCept  for interstitial lung disease. - Continue prednisone  20 mg daily. - Maintain Bactrim  due to prednisone  use. - Order blood tests to monitor labs while on CellCept ,  prednisone  - Schedule follow-up CT and PFTs in October or November. - Follow up with rheumatologist regarding Rinvoq approval.  Spinal stenosis, lumbar region Lumbar spinal stenosis is managed by a neurologist. He experiences right leg weakness and bowel and bladder incontinence. Previous imaging confirmed spinal stenosis but did not account for all symptoms. An MRI of the brain is scheduled to further investigate. - Follow up with neurologist for ongoing management. - Proceed with scheduled brain MRI on July 17.  Smoking dependence He smokes up to a pack a day and desires to quit. Previous attempts with nicotine  patches and Chantix  were unsuccessful due to insurance issues. Wellbutrin is suggested as an alternative for smoking cessation.  Time spent counseling-5 minutes.  Reassess at return visit - Prescribe Wellbutrin and OTC nicotine  patches for smoking cessation.  Elevated liver enzymes Previous liver enzyme tests in 2024 were elevated but have normalized. Monitoring liver function with blood tests today to ensure stability. - Order liver function tests today.    Plan/Recommendations: Continue CellCept , prednisone  Labs for monitoring Follow-up CT, PFTs   Lonna Coder MD [facility] 06/29/2023, 9:06 AM  CC: Stamey, Chiquita POUR, FNP

## 2023-06-29 NOTE — Patient Instructions (Signed)
 VISIT SUMMARY:  Today, you came in for a follow-up visit to discuss your pulmonary condition related to rheumatoid arthritis and interstitial lung disease. We reviewed your current medications and symptoms, including your recent weight gain and ongoing congestion. We also discussed your spinal stenosis, smoking habits, and depression management.  YOUR PLAN:  -RHEUMATOID ARTHRITIS WITH INTERSTITIAL LUNG DISEASE: Rheumatoid arthritis is an autoimmune disease that causes joint inflammation, and interstitial lung disease is a condition that affects the lung tissue. You will continue taking CellCept  and prednisone  to manage these conditions. We will also maintain your Bactrim  prescription due to prednisone  use. Blood tests will be ordered to monitor your blood counts, and follow-up CT and pulmonary function tests (PFTs) are scheduled for October or November. Additionally, you should follow up with your rheumatologist regarding the approval of Rinvoq.  -SPINAL STENOSIS, LUMBAR REGION: Spinal stenosis is a narrowing of the spaces within your spine, which can put pressure on the nerves. You will continue to follow up with your neurologist for ongoing management. An MRI of your brain is scheduled for July 17 to further investigate your symptoms.  -SMOKING DEPENDENCE: Smoking dependence is an addiction to tobacco products. Since previous attempts to quit smoking with nicotine  patches and Chantix  were unsuccessful, we will prescribe Wellbutrin to help you quit smoking.  -DEPRESSION: Depression is a mood disorder that causes persistent feelings of sadness and loss of interest. Your current medication is not fully effective, so we will prescribe Wellbutrin, which may also help with smoking cessation.  -ELEVATED LIVER ENZYMES: Elevated liver enzymes can indicate liver inflammation or damage. Your previous liver enzyme tests have normalized, but we will monitor your liver function with blood tests today to ensure  stability.  INSTRUCTIONS:  Please follow up with your rheumatologist regarding the approval of Rinvoq. Continue to follow up with your neurologist for the management of spinal stenosis and proceed with the scheduled brain MRI on July 17. We will also schedule follow-up CT and pulmonary function tests (PFTs) in October or November. Additionally, we will monitor your liver function with blood tests today.

## 2023-07-19 MED ORDER — GADOBUTROL 1 MMOL/ML IV SOLN: 8.7000 mL | Freq: Once | INTRAVENOUS | Status: DC | PRN

## 2023-07-20 ENCOUNTER — Other Ambulatory Visit (HOSPITAL_BASED_OUTPATIENT_CLINIC_OR_DEPARTMENT_OTHER): Admitting: Radiology

## 2023-07-20 ENCOUNTER — Inpatient Hospital Stay (HOSPITAL_BASED_OUTPATIENT_CLINIC_OR_DEPARTMENT_OTHER)
Admission: RE | Admit: 2023-07-20 | Discharge: 2023-07-20 | Disposition: A | Discharge status: Home / Self Care | Source: Ambulatory Visit | Attending: Neurology | Admitting: Neurology

## 2023-07-21 ENCOUNTER — Ambulatory Visit (HOSPITAL_BASED_OUTPATIENT_CLINIC_OR_DEPARTMENT_OTHER): Admission: RE | Admit: 2023-07-21 | Source: Ambulatory Visit

## 2023-07-21 ENCOUNTER — Other Ambulatory Visit: Payer: Self-pay | Admitting: Pulmonary Disease

## 2023-07-21 ENCOUNTER — Encounter: Payer: Self-pay | Admitting: Neurology

## 2023-07-21 DIAGNOSIS — M0510 Rheumatoid lung disease with rheumatoid arthritis of unspecified site: Secondary | ICD-10-CM

## 2023-07-24 NOTE — Telephone Encounter (Signed)
 I called him and got him scheduled

## 2023-07-28 ENCOUNTER — Other Ambulatory Visit: Payer: Self-pay | Admitting: Pulmonary Disease

## 2023-07-28 NOTE — Telephone Encounter (Signed)
 LOV indicated that patches were not helpful, and so we prescribed wellbutrin  Does he really want refill on patches? I called the pt so ask about this and there was no answer- LMTCB

## 2023-08-01 ENCOUNTER — Telehealth: Payer: Self-pay | Admitting: Pulmonary Disease

## 2023-08-01 NOTE — Telephone Encounter (Signed)
Lm x2 for patient.   

## 2023-08-01 NOTE — Telephone Encounter (Signed)
 Copied from CRM [id number]. Topic: General - Call Back - No Documentation >> Aug 01, 2023  1:30 PM Isabell A wrote: Reason for CRM: Patient returning phone call from [place].   Callback number: [phone]

## 2023-08-01 NOTE — Telephone Encounter (Signed)
 Routing to [place] for follow up as I do not see where patient was contacted.

## 2023-08-02 ENCOUNTER — Other Ambulatory Visit

## 2023-08-02 NOTE — Telephone Encounter (Signed)
 LMTCB and closing encounter per protocol.

## 2023-08-02 NOTE — Telephone Encounter (Signed)
 Please see 7/24 refill request.    Lm x2 for patient.        Interface, Surescripts Out to [facility]  SI    08/01/23 11:09 AM Pharmacy requested follow up on 08/01/2023. Winona Sonny HERO, CMA    07/28/23 11:53 AM Note LOV indicated that patches were not helpful, and so we prescribed wellbutrin  Does he really want refill on patches? I called the pt so ask about this and there was no answer- LMTCB

## 2023-08-04 ENCOUNTER — Other Ambulatory Visit: Payer: Self-pay | Admitting: Pulmonary Disease

## 2023-08-04 NOTE — Telephone Encounter (Signed)
 Dr. Theophilus, please advise if okay to refill. Med not mentioned in last OV note.

## 2023-08-08 ENCOUNTER — Ambulatory Visit

## 2023-08-08 DIAGNOSIS — R269 Unspecified abnormalities of gait and mobility: Secondary | ICD-10-CM | POA: Diagnosis not present

## 2023-08-08 DIAGNOSIS — R159 Full incontinence of feces: Secondary | ICD-10-CM

## 2023-08-08 MED ORDER — GADOBENATE DIMEGLUMINE 529 MG/ML IV SOLN
15.0000 mL | Freq: Once | INTRAVENOUS | Status: AC | PRN
Start: 2023-08-08 — End: 2023-08-08
  Administered 2023-08-08: 15 mL via INTRAVENOUS

## 2023-08-15 ENCOUNTER — Telehealth: Payer: Self-pay | Admitting: Neurology

## 2023-08-15 ENCOUNTER — Ambulatory Visit: Payer: Self-pay | Admitting: Neurology

## 2023-08-15 DIAGNOSIS — R269 Unspecified abnormalities of gait and mobility: Secondary | ICD-10-CM

## 2023-08-15 NOTE — Telephone Encounter (Signed)
  Orders Placed This Encounter  Procedures   NCV with EMG(electromyography)      move to August 14th at 11:00, let him know, he may have to wait,

## 2023-08-15 NOTE — Telephone Encounter (Signed)
 LVM and sent mychart msg informing pt of appt change with Dr Onita

## 2023-08-16 NOTE — Telephone Encounter (Signed)
 Spoke with patient and scheduled NCV/EMG with Dr. Onita for 09/07/23 at 4pm

## 2023-08-16 NOTE — Telephone Encounter (Signed)
 Pt call, returning call Pt stated could not come in at 11;00. Pt was informed that he wil get call back with new appt slot

## 2023-08-16 NOTE — Telephone Encounter (Signed)
 Called and spoke with patient and he stated he does not get off work until 1pm during the week so he would need a time after that. Dr Onita has a 4pm on 9/4, is it okay to offer that?

## 2023-08-16 NOTE — Telephone Encounter (Signed)
 LVM x2

## 2023-08-17 ENCOUNTER — Encounter: Admitting: Neurology

## 2023-08-21 ENCOUNTER — Other Ambulatory Visit: Payer: Self-pay | Admitting: Pulmonary Disease

## 2023-08-21 ENCOUNTER — Ambulatory Visit: Admitting: Neurology

## 2023-09-07 ENCOUNTER — Ambulatory Visit (INDEPENDENT_AMBULATORY_CARE_PROVIDER_SITE_OTHER): Admitting: Neurology

## 2023-09-07 VITALS — BP 132/80

## 2023-09-07 DIAGNOSIS — M48061 Spinal stenosis, lumbar region without neurogenic claudication: Secondary | ICD-10-CM

## 2023-09-07 DIAGNOSIS — G629 Polyneuropathy, unspecified: Secondary | ICD-10-CM | POA: Insufficient documentation

## 2023-09-07 DIAGNOSIS — I639 Cerebral infarction, unspecified: Secondary | ICD-10-CM | POA: Diagnosis not present

## 2023-09-07 DIAGNOSIS — G6289 Other specified polyneuropathies: Secondary | ICD-10-CM | POA: Diagnosis not present

## 2023-09-07 DIAGNOSIS — R269 Unspecified abnormalities of gait and mobility: Secondary | ICD-10-CM

## 2023-09-07 NOTE — Procedures (Signed)
 Full Name: Fernando Lloyd Gender: Male MRN #: [id number] Date of Birth: August 22, 1960    Visit Date: 09/07/2023 16:19 Age: 63 Years Examining Physician: Onita Duos Referring Physician: Onita Duos Height: 6 feet 1 inch History: 63 year old male presenting with new onset of gait abnormality for 3 years, initially mainly due to right leg weakness, acute worsening in past few weeks with noticeable left leg weakness  Summary of the test:  Nerve conduction study: Bilateral sural, superficial peroneal sensory responses were absent.  Right ulnar sensory response showed mild to moderately decreased snap amplitude, right radial sensory response was normal.  Right peroneal motor responses were within normal limit.  Left peroneal motor responses showed mildly decreased CMAP amplitude at distal stimulation side.  Bilateral tibial motor responses were within normal limit.  Left ulnar motor responses were normal.  Electromyography: Selected needle examination of bilateral lower extremity muscle, lumbosacral paraspinal muscles were performed.  There is evidence of mild chronic neuropathic changes involving bilateral L4-5 myotomes.  There was no active denervation at bilateral lumbosacral paraspinal muscles.  The left lower lumbar paraspinals were enlarged, mildly polyphasic   Conclusion: This is an abnormal study.  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    ------------------------------- Duos Onita. M.D. Ph.D.   [facility] [address], [address] [place], [place] [address] Tel: [phone] Fax: [phone]  Verbal informed consent was obtained from the patient, patient was informed of potential risk of procedure, including bruising, bleeding, hematoma formation, infection, muscle weakness,  muscle pain, numbness, among others.        MNC    Nerve / Sites Muscle Latency Ref. Amplitude Ref. Rel Amp Segments Distance Velocity Ref. Area    ms ms mV mV %  cm m/s m/s mVms  L Ulnar - ADM     Wrist ADM 2.8 <=3.3 6.7 >=6.0 100 Wrist - ADM 7   17.9     B.Elbow ADM 6.0  6.5  97.5 B.Elbow - Wrist 18 57 >=49 16.4     A.Elbow ADM 8.8  5.9  90 A.Elbow - B.Elbow 15 52 >=49 16.0  L Peroneal - EDB     Ankle EDB 5.9 <=6.5 2.1 >=2.0 100 Ankle - EDB 9   7.9     Fib head EDB 13.1  2.4  114 Fib head - Ankle 29 40 >=44 10.6     Pop fossa EDB 15.6  2.0  83.6 Pop fossa - Fib head 10 41 >=44 8.7         Acc Peron - Pop fossa      R Peroneal - EDB     Ankle EDB 5.6 <=6.5 1.5 >=2.0 100 Ankle - EDB 9   4.9     Fib head EDB 13.1  1.3  84 Fib head - Ankle 29 39 >=44 5.1     Pop fossa EDB 15.6  3.1  248 Pop fossa - Fib head 8 31 >=44 10.7         Acc Peron - Pop fossa      L Tibial - AH     Ankle AH 5.3 <=5.8 7.8 >=4.0 100 Ankle - AH 9   19.5     Pop fossa AH 17.0  5.6  72.4 Pop fossa - Ankle 48 41 >=41 16.5  R Tibial - AH     Ankle  AH 4.5 <=5.8 10.7 >=4.0 100 Ankle - AH 9   23.3     Pop fossa AH 16.1  6.0  56.2 Pop fossa - Ankle 41 35 >=41 17.8                 SNC    Nerve / Sites Rec. Site Peak Lat Ref.  Amp Ref. Segments Distance    ms ms V V  cm  R Radial - Anatomical snuff box (Forearm)     Forearm Wrist 2.1 <=2.9 17 >=15 Forearm - Wrist 10  R Sural - Ankle (Calf)     Calf Ankle NR <=4.4 NR >=6 Calf - Ankle 14  L Sural - Ankle (Calf)     Calf Ankle NR <=4.4 NR >=6 Calf - Ankle 14  R Superficial peroneal - Ankle     Lat leg Ankle NR <=4.4 NR >=6 Lat leg - Ankle 14  L Superficial peroneal - Ankle     Lat leg Ankle NR <=4.4 NR >=6 Lat leg - Ankle 14  R Ulnar - Orthodromic, (Dig V, Mid palm)     Dig V Wrist 3.0 <=3.1 3 >=5 Dig V - Wrist 31                 F  Wave    Nerve F Lat Ref.   ms ms  L Tibial - AH 57.0 <=56.0  R Tibial - AH 56.4 <=56.0  L Ulnar - ADM 30.9 <=32.0            EMG Summary Table    Spontaneous MUAP Recruitment  Muscle IA Fib PSW Fasc Other Amp Dur. Poly Pattern  R. Tibialis anterior Normal None None None _______ Normal Normal Normal Reduced  R. Tibialis posterior Normal None None None _______ Normal Normal Normal Reduced  R. Peroneus longus Normal None None None _______ Normal Normal Normal Reduced  R. Gastrocnemius (Medial head) Normal None None None _______ Normal Normal Normal Reduced  R. Vastus lateralis Normal None None None _______ Increased Increased 1+ Reduced  R. Biceps femoris (short head) Normal None None None _______ Normal Normal Normal Normal  R. Gluteus medius Normal None None None _______ Normal Normal Normal Normal  R. Lumbar paraspinals (low) Normal None None None _______ Increased Increased Normal Normal  R. Lumbar paraspinals (mid) Normal None None None _______ Increased Increased 1+ Normal  L. Tibialis posterior Normal None None None _______ Normal Normal Normal Reduced  L. Tibialis anterior Normal None None None _______ Normal Normal Normal Reduced  L. Peroneus longus Normal None None None _______ Normal Normal Normal Reduced  L. Gastrocnemius (Medial head) Normal None None None _______ Normal Normal Normal Reduced  L. Vastus lateralis Normal None None None _______ Increased Increased 1+ Reduced  L. Lumbar paraspinals (low) Normal None None None _______ Normal Normal Normal Normal  L. Lumbar paraspinals (mid) Normal None None None _______ Normal Normal Normal Normal

## 2023-09-07 NOTE — Progress Notes (Signed)
 Chief Complaint  Patient presents with   nerve conduction    RM 4      ASSESSMENT AND PLAN  Fernando Lloyd is a 63 y.o. 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   MEDICAL HISTORY:  Fernando Lloyd is a 63 year old male accompanied by his wife, seen in request by his primary care from [facility] nurse practitioner Stamey, Chiquita for evaluation of gait abnormality, initial evaluation May 18, 2023   History is obtained from the patient and review of electronic medical records. I personally reviewed pertinent available imaging films in PACS.   PMHx of  Interstitial lung disease, pulmonary fibrosis, on prednisone , cellcept  since 2023 Smoke 1ppd Alcohol more than 6 beers a day. CAD Rheumatoid arthritis, multiple joints pain,  Stroke,   I saw him in November 2020 for acute onset of dizziness, unsteady gait, MRI of the brain then showed no acute abnormality, evidence of mild to moderate small vessel disease, chronic lacunar infarction in the left and mid pons MRA of head and neck showed 20% stenosis of right internal carotid artery, otherwise no hemodynamic large vessel disease  He developed slow worsening shortness of breath with exertion, CT chest in December 2023 confirmed pulmonary fibrosis in the past him with apical to basal gradient, featuring irregular peripheral and peri-bronchovascular  interstitial opacity, septal thickening, groundglass, diffuse bilateral bronchial wall thickening,  He was eventually diagnosed with interstitial lung disease, pulmonary fibrosis, has been treated with prednisone  and CellCept   He has increased difficulty, 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  Lab in March 2025: Hg 13.5,  creat 0.7,  ALP 140,   UPDATE Sept 4th 2025:   PHYSICAL EXAM:   Vitals:   09/07/23 1554  BP: 132/80   There is no height or weight on file to calculate BMI.  PHYSICAL EXAMNIATION:  Gen: NAD, conversant, well nourised, well groomed                     Cardiovascular: Regular rate rhythm, no peripheral edema, warm, nontender. Eyes: Conjunctivae clear without exudates or hemorrhage Neck: Supple, no carotid bruits. Pulmonary: Clear to auscultation bilaterall  NEUROLOGICAL EXAM:  MENTAL STATUS: Speech/cognition: Awake, alert, oriented to history taking and casual conversation CRANIAL NERVES: CN II: Visual fields are full to confrontation. Pupils are round equal and briskly reactive to light. CN III, IV, VI: extraocular movement are normal.  Left static ptosis, had evidence of left temporal region skin cancer removal CN V: Facial sensation is intact to light touch CN VII: Face is symmetric with  normal eye closure  CN VIII: Hearing is normal to causal conversation. CN IX, X: Phonation is normal. CN XI: Head turning and shoulder shrug are intact  MOTOR: Right  anterior thigh atrophy, R/L, hip flexion 4/5, knee extension 4/5, knee flexion 5-/5, ankle dorsiflexion4/4, ankle plantarflexion5-/5-  Upper extremity motor strength was normal  REFLEXES: Reflexes are 2+ and symmetric at the biceps, triceps, knees (2/L2), and absent at ankles. Plantar responses are extensor bilaterally  SENSORY: Mildly length-dependent decreased light touch vibratory sensation, preserved toe proprioception  COORDINATION: There is no trunk or limb dysmetria noted.  GAIT/STANCE: Push-up to get up from seated position, dragging right leg  REVIEW OF SYSTEMS:  Full 14 system review of systems performed and notable only for as above All other review of systems were negative.   ALLERGIES: Allergies  Allergen Reactions   Other     ROSES (flower)    Other Reaction(s): Sneezing and itchiness  ROSES (flower)   Other Reaction(s): Sneezing and itchiness   Penicillin V Potassium     Other Reaction(s): Unknown    HOME MEDICATIONS: Current Outpatient Medications  Medication Sig Dispense Refill   albuterol  (PROVENTIL ) (2.5 MG/3ML) 0.083% nebulizer solution Take 3 mLs (2.5 mg total) by nebulization every 4 (four) hours as needed. 75 mL 12   albuterol  (VENTOLIN  HFA) 108 (90 Base) MCG/ACT inhaler INHALE 2 PUFFS BY MOUTH EVERY 4 HOURS AS NEEDED FOR WHEEZE OR FOR SHORTNESS OF BREATH 18 each 2   ALPRAZolam (XANAX) 0.25 MG tablet Take 0.25 mg by mouth See admin instructions. Take 0.25 mg by mouth one to two times a day as needed for anxiety     Budeson-Glycopyrrol-Formoterol (BREZTRI  AEROSPHERE) 160-9-4.8 MCG/ACT AERO Inhale 2 puffs into the lungs 2 (two) times daily. 10.7 g 11   buPROPion  (WELLBUTRIN  SR) 150 MG 12 hr tablet Take 1 tablet (150 mg total) by mouth daily. 90 tablet 1   famotidine  (PEPCID ) 20 MG tablet ONE AFTER SUPPER 90 tablet 3   folic acid (FOLVITE) 1 MG tablet 1 tablet Orally Once a day for 60 days     gabapentin  (NEURONTIN ) 300 MG capsule Take 1 capsule (300 mg  total) by mouth 3 (three) times daily. 90 capsule 11   isosorbide  mononitrate (IMDUR ) 60 MG 24 hr tablet Take 1 tablet (60 mg total) by mouth daily. 30 tablet 0   leflunomide (ARAVA) 20 MG tablet Take 20 mg by mouth daily.     mycophenolate  (CELLCEPT ) 500 MG tablet TAKE 1500MG  TWICE DAILY 540 tablet 1   mycophenolate  (CELLCEPT ) 500 MG tablet TAKE 2 TABLETS BY MOUTH TWICE A DAY 360 tablet 1   mycophenolate  (CELLCEPT ) 500 MG tablet TAKE 2 TABLETS BY MOUTH TWICE A DAY 360 tablet 1   nicotine  (NICODERM CQ  - DOSED IN MG/24 HOURS) 14 mg/24hr patch Place 1 patch (14 mg total) onto the skin daily. 28 patch 0   pantoprazole  (PROTONIX ) 40 MG tablet TAKE 1 TABLET (40 MG TOTAL) BY MOUTH DAILY. TAKE 30-60 MIN BEFORE FIRST MEAL OF THE DAY 90 tablet 3   predniSONE  (DELTASONE ) 10 MG tablet TAKE 2 TABLETS BY MOUTH DAILY WITH BREAKFAST 60 tablet 1   sulfamethoxazole -trimethoprim  (BACTRIM  DS) 800-160 MG tablet Take 1 tablet by mouth 3 (three) times a week. 12 tablet 5   Varenicline  Tartrate, Starter, (CHANTIX  STARTING MONTH PAK) 0.5 MG X 11 & 1 MG X 42 TBPK Take 1 0.5 mg tablet once daily for 3 days, increase to 1 0.5 mg tablet twice daily for 4  days, increase to 1 1 mg tablet twice daily. 53 each 0   No current facility-administered medications for this visit.    PAST MEDICAL HISTORY: Past Medical History:  Diagnosis Date   Anxiety    Ataxia    Hyperlipidemia    Hypertension    Myocardial infarct (HCC) 11/23/2014   RCA stent 3.5x23 DES in [place]   Rheumatoid arthritis (HCC)     PAST SURGICAL HISTORY: Past Surgical History:  Procedure Laterality Date   CARDIAC CATHETERIZATION N/A 11/23/2014   CARPAL TUNNEL RELEASE     CORONARY ANGIOPLASTY     2016   CORONARY ANGIOPLASTY WITH STENT PLACEMENT      FAMILY HISTORY: Family History  Problem Relation Age of Onset   Dementia Mother    Breast cancer Mother    Other Father        unsure of history    SOCIAL HISTORY: Social History   Socioeconomic  History   Marital status: Married    Spouse name: Not on file   Number of children: 1   Years of education: some college   Highest education level: Not on file  Occupational History   Not on file  Tobacco Use   Smoking status: Every Day    Current packs/day: 1.00    Average packs/day: 1 pack/day for 43.0 years (43.0 ttl pk-yrs)    Types: Cigarettes   Smokeless tobacco: Never   Tobacco comments:    1- 1.5 packs daily 06/09/22/lt  Vaping Use   Vaping status: Never Used  Substance and Sexual Activity   Alcohol use: Yes    Alcohol/week: 12.0 standard drinks of alcohol    Types: 12 Cans of beer per week   Drug use: Never   Sexual activity: Not on file  Other Topics Concern   Not on file  Social History Narrative   Lives at home with his wife.   Right-handed.   1-2 cups per day.   Social Drivers of Corporate investment banker Strain: Not on file  Food Insecurity: Not on file  Transportation Needs: Not on file  Physical Activity: Not on file  Stress: Not on file  Social Connections: Not on file  Intimate Partner Violence: Not on file      Modena Callander, M.D. Ph.D.  [facility] [address] [place], [place] [address] Ph: [phone] Fax: [phone]  CC:  Stamey, Chiquita POUR, FNP [address] [place],  [place] [address]  Stamey, Chiquita POUR, FNP

## 2023-09-07 NOTE — Progress Notes (Signed)
 Chief Complaint  Patient presents with   nerve conduction    RM 4      ASSESSMENT AND PLAN  Fernando Lloyd is a 63 y.o. male    Chronic progressive worsening gait abnormality, acute worsening since August 2025 Bowel and bladder incontinence History of rheumatoid arthritis  He has significant right hip flexion weakness, moderate bilateral ankle dorsiflexion weakness, mild right upper extremity weakness, hyperreflexia on right side,  Chronic left pontine stroke could explain his right side difficulty, but his moderate bilateral ankle dorsiflexion weakness and gait abnormality were not explained by mild peripheral neuropathy and lumbosacral radiculopathy  His gait abnormality likely due to multifactorial, including long history of morbid alcohol abuse, mild residual right upper and lower extremity weakness from left pontine stroke,  mild peripheral neuropathy, with mild lumbosacral radiculopathy,  I do not think he is a surgical candidate for lumbar decompression  Complete laboratory evaluation for peripheral neuropathy  Refer to [facility] neuromuscular specialist for second opinion per patient  Physical therapy  Return in 9 to 12 months    DIAGNOSTIC DATA (LABS, IMAGING, TESTING) - I reviewed patient records, labs, notes, testing and imaging myself where available.   MEDICAL HISTORY:  Fernando Lloyd is a 63 year old male accompanied by his wife, seen in request by his primary care from [facility] nurse practitioner Stamey, Chiquita for evaluation of gait abnormality, initial evaluation May 18, 2023    History is obtained from the patient and review of electronic medical records. I personally reviewed pertinent available imaging films in PACS.   PMHx of  Interstitial lung disease, pulmonary fibrosis, on prednisone , cellcept  since 2023 Smoke 1ppd Alcohol more than 6 beers a day. CAD Rheumatoid arthritis, multiple joints pain,  Stroke,   I saw him in November 2020 for acute  onset of dizziness, unsteady gait, MRI of the brain then showed no acute abnormality, evidence of mild to moderate small vessel disease, chronic lacunar infarction in the left and mid pons MRA of head and neck showed 20% stenosis of right internal carotid artery, otherwise no hemodynamic large vessel disease  He developed slow worsening shortness of breath with exertion, CT chest in December 2023 confirmed pulmonary fibrosis in the past him with apical to basal gradient, featuring irregular peripheral and peri-bronchovascular interstitial opacity, septal thickening, groundglass, diffuse bilateral bronchial wall thickening,  He was eventually diagnosed with interstitial lung disease, pulmonary fibrosis, has been treated with prednisone  and CellCept   He has increased difficulty, 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  Lab in March 2025: Hg 13.5,  creat 0.7,  ALP 140,   Update September 07, 2023: Extensive evaluation for his worsening gait abnormality mild right arm weakness MRIs in 2025: MRI of brain, chronic left pontine ischemic infarction, moderate periventricular small vessel disease MRI of cervical spine multilevel degenerative changes, no evidence of canal stenosis, variable degree of foraminal narrowing, not noticeable at moderate left C3-4, MRI of lumbar, chronic multiple level degenerative changes, mild to moderate central canal stenosis at L3-4, moderate lateral recess stenosis potential  impingement of  left L4 MRI of thoracic spine was normal  He drinks moderate alcohol 5-6 beers daily, smoke, also complains of bowel and bladder incontinence over the past couple years  EMG nerve conduction study September 07, 2023 showed 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   PHYSICAL EXAM:   Vitals:   09/07/23 1554  BP: 132/80   PHYSICAL EXAMNIATION:  Gen: NAD, conversant, well nourised, well groomed                     Cardiovascular: Regular rate rhythm, no peripheral edema, warm, nontender. Eyes: Conjunctivae clear without exudates or hemorrhage Neck: Supple, no carotid bruits. Pulmonary: Clear to auscultation bilaterall  NEUROLOGICAL EXAM:  MENTAL STATUS: Speech/cognition: Awake, alert, oriented to history taking and casual conversation CRANIAL NERVES: CN II: Visual fields are full to confrontation. Pupils are round equal and briskly reactive to light. CN III, IV, VI: extraocular movement are normal.  Left static ptosis, had evidence of left temporal region skin cancer removal CN V: Facial sensation is intact to light touch CN VII: Face is symmetric with normal eye closure  CN VIII: Hearing is normal to causal conversation. CN IX, X: Phonation is normal. CN XI: Head turning and shoulder shrug are intact  MOTOR: Mild right arm external rotation, abduction weakness. R/L, hip flexion 4-/4, knee extension 4+/5, knee flexion 5/5, ankle dorsiflexion4/4, ankle plantarflexion5-/5-  REFLEXES: Hyperreflexia of right upper extremity, patella, and absent at ankles. Plantar responses are extensor on right, flexor on left  SENSORY: Mildly length-dependent decreased light touch vibratory sensation, preserved toe proprioception  COORDINATION: There is no trunk or limb dysmetria noted.  GAIT/STANCE: Push-up to get up from seated position, bilateral foot drop drags right leg  more  REVIEW OF SYSTEMS:  Full 14 system review of systems performed and notable only for as above All other review of systems were negative.   ALLERGIES: Allergies  Allergen Reactions   Other     ROSES (flower)    Other Reaction(s): Sneezing and itchiness  ROSES (flower)   Other Reaction(s): Sneezing and itchiness   Penicillin V Potassium     Other Reaction(s): Unknown    HOME MEDICATIONS: Current Outpatient Medications  Medication Sig Dispense Refill   albuterol  (PROVENTIL ) (2.5 MG/3ML) 0.083% nebulizer solution Take 3 mLs (2.5 mg total) by nebulization every 4 (four) hours as needed. 75 mL 12   albuterol  (VENTOLIN  HFA) 108 (90 Base) MCG/ACT inhaler INHALE 2 PUFFS BY MOUTH EVERY 4 HOURS AS NEEDED FOR WHEEZE OR FOR SHORTNESS OF BREATH 18 each 2   ALPRAZolam (XANAX) 0.25 MG tablet Take 0.25 mg by mouth See admin instructions. Take 0.25 mg by mouth one to two times a day as needed for anxiety     Budeson-Glycopyrrol-Formoterol (BREZTRI  AEROSPHERE) 160-9-4.8 MCG/ACT AERO Inhale 2 puffs into the lungs 2 (two) times daily. 10.7 g 11   buPROPion  (WELLBUTRIN  SR) 150 MG 12 hr tablet Take 1 tablet (150 mg total) by mouth daily. 90 tablet 1   famotidine  (PEPCID ) 20 MG tablet ONE AFTER SUPPER 90 tablet 3   folic acid (FOLVITE) 1 MG tablet 1 tablet Orally Once a day for 60 days     gabapentin  (NEURONTIN ) 300 MG capsule Take 1 capsule (300 mg total) by mouth 3 (three) times daily. 90 capsule 11   isosorbide  mononitrate (IMDUR ) 60 MG 24 hr tablet Take 1 tablet (60 mg total) by mouth daily. 30 tablet  0   leflunomide (ARAVA) 20 MG tablet Take 20 mg by mouth daily.     mycophenolate  (CELLCEPT ) 500 MG tablet TAKE 1500MG  TWICE DAILY 540 tablet 1   mycophenolate  (CELLCEPT ) 500 MG tablet TAKE 2 TABLETS BY MOUTH TWICE A DAY 360 tablet 1   mycophenolate  (CELLCEPT ) 500 MG tablet TAKE 2 TABLETS BY MOUTH TWICE A DAY 360 tablet 1   nicotine  (NICODERM CQ  - DOSED IN MG/24 HOURS) 14 mg/24hr patch Place 1  patch (14 mg total) onto the skin daily. 28 patch 0   pantoprazole  (PROTONIX ) 40 MG tablet TAKE 1 TABLET (40 MG TOTAL) BY MOUTH DAILY. TAKE 30-60 MIN BEFORE FIRST MEAL OF THE DAY 90 tablet 3   predniSONE  (DELTASONE ) 10 MG tablet TAKE 2 TABLETS BY MOUTH DAILY WITH BREAKFAST 60 tablet 1   sulfamethoxazole -trimethoprim  (BACTRIM  DS) 800-160 MG tablet Take 1 tablet by mouth 3 (three) times a week. 12 tablet 5   Varenicline  Tartrate, Starter, (CHANTIX  STARTING MONTH PAK) 0.5 MG X 11 & 1 MG X 42 TBPK Take 1 0.5 mg tablet once daily for 3 days, increase to 1 0.5 mg tablet twice daily for 4 days, increase to 1 1 mg tablet twice daily. 53 each 0   No current facility-administered medications for this visit.    PAST MEDICAL HISTORY: Past Medical History:  Diagnosis Date   Anxiety    Ataxia    Hyperlipidemia    Hypertension    Myocardial infarct (HCC) 11/23/2014   RCA stent 3.5x23 DES in [place]   Rheumatoid arthritis (HCC)     PAST SURGICAL HISTORY: Past Surgical History:  Procedure Laterality Date   CARDIAC CATHETERIZATION N/A 11/23/2014   CARPAL TUNNEL RELEASE     CORONARY ANGIOPLASTY     2016   CORONARY ANGIOPLASTY WITH STENT PLACEMENT      FAMILY HISTORY: Family History  Problem Relation Age of Onset   Dementia Mother    Breast cancer Mother    Other Father        unsure of history    SOCIAL HISTORY: Social History   Socioeconomic History   Marital status: Married    Spouse name: Not on file   Number of children: 1   Years of education: some college   Highest education level: Not on file  Occupational History   Not on file  Tobacco Use   Smoking status: Every Day    Current packs/day: 1.00    Average packs/day: 1 pack/day for 43.0 years (43.0 ttl pk-yrs)    Types: Cigarettes   Smokeless tobacco: Never   Tobacco comments:    1- 1.5 packs daily 06/09/22/lt  Vaping Use   Vaping status: Never Used  Substance and Sexual Activity   Alcohol use: Yes    Alcohol/week: 12.0  standard drinks of alcohol    Types: 12 Cans of beer per week   Drug use: Never   Sexual activity: Not on file  Other Topics Concern   Not on file  Social History Narrative   Lives at home with his wife.   Right-handed.   1-2 cups per day.   Social Drivers of Corporate investment banker Strain: Not on file  Food Insecurity: Not on file  Transportation Needs: Not on file  Physical Activity: Not on file  Stress: Not on file  Social Connections: Not on file  Intimate Partner Violence: Not on file      Modena Callander, M.D. Ph.D.  [facility] [address],  [address] [place], [place] [address] Ph: [phone] Fax: [phone]  CC:  Stamey, Chiquita POUR, FNP [address] [place],  [place] [address]  Stamey, Chiquita POUR, FNP

## 2023-09-12 ENCOUNTER — Ambulatory Visit: Admitting: Neurology

## 2023-09-13 ENCOUNTER — Telehealth: Payer: Self-pay | Admitting: Neurology

## 2023-09-13 NOTE — Telephone Encounter (Signed)
 Referral for neurology fax to [facility]. Phone: [phone], Fax: [phone]

## 2023-09-13 NOTE — Telephone Encounter (Signed)
 Referral for physical therapy fax to [facility]. Phone: [phone], Fax: [phone]

## 2023-09-16 ENCOUNTER — Telehealth: Admitting: Family Medicine

## 2023-09-16 DIAGNOSIS — J019 Acute sinusitis, unspecified: Secondary | ICD-10-CM | POA: Diagnosis not present

## 2023-09-16 MED ORDER — NAPROXEN 500 MG PO TABS: 500.0000 mg | ORAL_TABLET | Freq: Two times a day (BID) | ORAL | 0 refills | Status: AC

## 2023-09-16 MED ORDER — DOXYCYCLINE HYCLATE 100 MG PO TABS: 100.0000 mg | ORAL_TABLET | Freq: Two times a day (BID) | ORAL | 0 refills | Status: AC

## 2023-09-16 NOTE — Progress Notes (Signed)
 Virtual Visit Consent   [name], you are scheduled for a virtual visit with a [facility] provider today. Just as with appointments in the office, your consent must be obtained to participate. Your consent will be active for this visit and any virtual visit you may have with one of our providers in the next 365 days. If you have a MyChart account, a copy of this consent can be sent to you electronically.  As this is a virtual visit, video technology does not allow for your provider to perform a traditional examination. This may limit your provider's ability to fully assess your condition. If your provider identifies any concerns that need to be evaluated in person or the need to arrange testing (such as labs, EKG, etc.), we will make arrangements to do so. Although advances in technology are sophisticated, we cannot ensure that it will always work on either your end or our end. If the connection with a video visit is poor, the visit may have to be switched to a telephone visit. With either a video or telephone visit, we are not always able to ensure that we have a secure connection.  By engaging in this virtual visit, you consent to the provision of healthcare and authorize for your insurance to be billed (if applicable) for the services provided during this visit. Depending on your insurance coverage, you may receive a charge related to this service.  I need to obtain your verbal consent now. Are you willing to proceed with your visit today? Sten Dematteo has provided verbal consent on 09/16/2023 for a virtual visit (video or telephone). Fernando Lloyd, [place]  Date: 09/16/2023 3:22 PM   Virtual Visit via Video Note   I, Fernando Lloyd, connected with  Kaseem Vastine  ([id number], 03-Feb-1960) on 09/16/23 at  3:15 PM EDT by a video-enabled telemedicine application and verified that I am speaking with the correct person using two identifiers.  Location: Patient: Virtual Visit Location  Patient: Home Provider: Virtual Visit Location Provider: Home Office   I discussed the limitations of evaluation and management by telemedicine and the availability of in person appointments. The patient expressed understanding and agreed to proceed.    History of Present Illness: Fernando Lloyd is a 63 y.o. who identifies as a male who was assigned male at birth, and is being seen today for c/o sinus infection that is not going away.  Pt c/o sinus pain on top of jaw. Pt states he has a headache. Pt states taking tylenol  but is not helping.  Pt states he has congestion and has had symptoms for about 3 days. Pt states has not tried flonase and has them several times a year.  Pt denies being around sick contacts.   HPI: HPI  Problems:  Patient Active Problem List   Diagnosis Date Noted   Peripheral neuropathy 09/07/2023   Spinal stenosis of lumbar region 09/07/2023   Gait abnormality 05/18/2023   Incontinence of feces 05/18/2023   Asthmatic bronchitis , chronic (HCC) 11/19/2021   Rheumatoid lung disease (HCC) 10/22/2021   Cigarette smoker 10/22/2021   Cerebrovascular accident (CVA) (HCC) 11/15/2018   Coronary artery disease involving native coronary artery of native heart without angina pectoris 06/14/2018    Allergies:  Allergies  Allergen Reactions   Other     ROSES (flower)    Other Reaction(s): Sneezing and itchiness  ROSES (flower)   Other Reaction(s): Sneezing and itchiness   Penicillin V Potassium     Other Reaction(s): Unknown  Medications:  Current Outpatient Medications:    doxycycline  (VIBRA -TABS) 100 MG tablet, Take 1 tablet (100 mg total) by mouth 2 (two) times daily for 7 days., Disp: 14 tablet, Rfl: 0   naproxen  (NAPROSYN ) 500 MG tablet, Take 1 tablet (500 mg total) by mouth 2 (two) times daily with a meal for 7 days., Disp: 14 tablet, Rfl: 0   albuterol  (PROVENTIL ) (2.5 MG/3ML) 0.083% nebulizer solution, Take 3 mLs (2.5 mg total) by nebulization every 4 (four)  hours as needed., Disp: 75 mL, Rfl: 12   albuterol  (VENTOLIN  HFA) 108 (90 Base) MCG/ACT inhaler, INHALE 2 PUFFS BY MOUTH EVERY 4 HOURS AS NEEDED FOR WHEEZE OR FOR SHORTNESS OF BREATH, Disp: 18 each, Rfl: 2   ALPRAZolam (XANAX) 0.25 MG tablet, Take 0.25 mg by mouth See admin instructions. Take 0.25 mg by mouth one to two times a day as needed for anxiety, Disp: , Rfl:    Budeson-Glycopyrrol-Formoterol (BREZTRI  AEROSPHERE) 160-9-4.8 MCG/ACT AERO, Inhale 2 puffs into the lungs 2 (two) times daily., Disp: 10.7 g, Rfl: 11   buPROPion  (WELLBUTRIN  SR) 150 MG 12 hr tablet, Take 1 tablet (150 mg total) by mouth daily., Disp: 90 tablet, Rfl: 1   famotidine  (PEPCID ) 20 MG tablet, ONE AFTER SUPPER, Disp: 90 tablet, Rfl: 3   folic acid (FOLVITE) 1 MG tablet, 1 tablet Orally Once a day for 60 days, Disp: , Rfl:    gabapentin  (NEURONTIN ) 300 MG capsule, Take 1 capsule (300 mg total) by mouth 3 (three) times daily., Disp: 90 capsule, Rfl: 11   isosorbide  mononitrate (IMDUR ) 60 MG 24 hr tablet, Take 1 tablet (60 mg total) by mouth daily., Disp: 30 tablet, Rfl: 0   leflunomide (ARAVA) 20 MG tablet, Take 20 mg by mouth daily., Disp: , Rfl:    mycophenolate  (CELLCEPT ) 500 MG tablet, TAKE 1500MG  TWICE DAILY, Disp: 540 tablet, Rfl: 1   mycophenolate  (CELLCEPT ) 500 MG tablet, TAKE 2 TABLETS BY MOUTH TWICE A DAY, Disp: 360 tablet, Rfl: 1   mycophenolate  (CELLCEPT ) 500 MG tablet, TAKE 2 TABLETS BY MOUTH TWICE A DAY, Disp: 360 tablet, Rfl: 1   nicotine  (NICODERM CQ  - DOSED IN MG/24 HOURS) 14 mg/24hr patch, Place 1 patch (14 mg total) onto the skin daily., Disp: 28 patch, Rfl: 0   pantoprazole  (PROTONIX ) 40 MG tablet, TAKE 1 TABLET (40 MG TOTAL) BY MOUTH DAILY. TAKE 30-60 MIN BEFORE FIRST MEAL OF THE DAY, Disp: 90 tablet, Rfl: 3   predniSONE  (DELTASONE ) 10 MG tablet, TAKE 2 TABLETS BY MOUTH DAILY WITH BREAKFAST, Disp: 60 tablet, Rfl: 1   sulfamethoxazole -trimethoprim  (BACTRIM  DS) 800-160 MG tablet, Take 1 tablet by mouth 3  (three) times a week., Disp: 12 tablet, Rfl: 5   Varenicline  Tartrate, Starter, (CHANTIX  STARTING MONTH PAK) 0.5 MG X 11 & 1 MG X 42 TBPK, Take 1 0.5 mg tablet once daily for 3 days, increase to 1 0.5 mg tablet twice daily for 4 days, increase to 1 1 mg tablet twice daily., Disp: 53 each, Rfl: 0  Observations/Objective: Patient is well-developed, well-nourished in no acute distress.  Resting comfortably at home.  Head is normocephalic, atraumatic.  No labored breathing.  Speech is clear and coherent with logical content.  Patient is alert and oriented at baseline.    Assessment and Plan: 1. Acute sinusitis, recurrence not specified, unspecified location (Primary) - naproxen  (NAPROSYN ) 500 MG tablet; Take 1 tablet (500 mg total) by mouth 2 (two) times daily with a meal for 7 days.  Dispense: 14  tablet; Refill: 0 - doxycycline  (VIBRA -TABS) 100 MG tablet; Take 1 tablet (100 mg total) by mouth 2 (two) times daily for 7 days.  Dispense: 14 tablet; Refill: 0  -Start Doxycycline  and Naproxen   -Pt to F/U with PCP  if symptoms persist or worsen   Follow Up Instructions: I discussed the assessment and treatment plan with the patient. The patient was provided an opportunity to ask questions and all were answered. The patient agreed with the plan and demonstrated an understanding of the instructions.  A copy of instructions were sent to the patient via MyChart unless otherwise noted below.    The patient was advised to call back or seek an in-person evaluation if the symptoms worsen or if the condition fails to improve as anticipated.    Fernando Mater, PA-C

## 2023-09-16 NOTE — Patient Instructions (Signed)
 Fernando Lloyd, thank you for joining Roosvelt Mater, PA-C for today's virtual visit.  While this provider is not your primary care provider (PCP), if your PCP is located in our provider database this encounter information will be shared with them immediately following your visit.   A [facility] MyChart account gives you access to today's visit and all your visits, tests, and labs performed at [facility]  click here if you don't have a [facility] MyChart account or go to [email or web address]  Consent: (Patient) Fernando Lloyd provided verbal consent for this virtual visit at the beginning of the encounter.  Current Medications:  Current Outpatient Medications:    doxycycline  (VIBRA -TABS) 100 MG tablet, Take 1 tablet (100 mg total) by mouth 2 (two) times daily for 7 days., Disp: 14 tablet, Rfl: 0   naproxen  (NAPROSYN ) 500 MG tablet, Take 1 tablet (500 mg total) by mouth 2 (two) times daily with a meal for 7 days., Disp: 14 tablet, Rfl: 0   albuterol  (PROVENTIL ) (2.5 MG/3ML) 0.083% nebulizer solution, Take 3 mLs (2.5 mg total) by nebulization every 4 (four) hours as needed., Disp: 75 mL, Rfl: 12   albuterol  (VENTOLIN  HFA) 108 (90 Base) MCG/ACT inhaler, INHALE 2 PUFFS BY MOUTH EVERY 4 HOURS AS NEEDED FOR WHEEZE OR FOR SHORTNESS OF BREATH, Disp: 18 each, Rfl: 2   ALPRAZolam (XANAX) 0.25 MG tablet, Take 0.25 mg by mouth See admin instructions. Take 0.25 mg by mouth one to two times a day as needed for anxiety, Disp: , Rfl:    Budeson-Glycopyrrol-Formoterol (BREZTRI  AEROSPHERE) 160-9-4.8 MCG/ACT AERO, Inhale 2 puffs into the lungs 2 (two) times daily., Disp: 10.7 g, Rfl: 11   buPROPion  (WELLBUTRIN  SR) 150 MG 12 hr tablet, Take 1 tablet (150 mg total) by mouth daily., Disp: 90 tablet, Rfl: 1   famotidine  (PEPCID ) 20 MG tablet, ONE AFTER SUPPER, Disp: 90 tablet, Rfl: 3   folic acid (FOLVITE) 1 MG tablet, 1 tablet Orally Once a day for 60 days, Disp: , Rfl:    gabapentin   (NEURONTIN ) 300 MG capsule, Take 1 capsule (300 mg total) by mouth 3 (three) times daily., Disp: 90 capsule, Rfl: 11   isosorbide  mononitrate (IMDUR ) 60 MG 24 hr tablet, Take 1 tablet (60 mg total) by mouth daily., Disp: 30 tablet, Rfl: 0   leflunomide (ARAVA) 20 MG tablet, Take 20 mg by mouth daily., Disp: , Rfl:    mycophenolate  (CELLCEPT ) 500 MG tablet, TAKE 1500MG  TWICE DAILY, Disp: 540 tablet, Rfl: 1   mycophenolate  (CELLCEPT ) 500 MG tablet, TAKE 2 TABLETS BY MOUTH TWICE A DAY, Disp: 360 tablet, Rfl: 1   mycophenolate  (CELLCEPT ) 500 MG tablet, TAKE 2 TABLETS BY MOUTH TWICE A DAY, Disp: 360 tablet, Rfl: 1   nicotine  (NICODERM CQ  - DOSED IN MG/24 HOURS) 14 mg/24hr patch, Place 1 patch (14 mg total) onto the skin daily., Disp: 28 patch, Rfl: 0   pantoprazole  (PROTONIX ) 40 MG tablet, TAKE 1 TABLET (40 MG TOTAL) BY MOUTH DAILY. TAKE 30-60 MIN BEFORE FIRST MEAL OF THE DAY, Disp: 90 tablet, Rfl: 3   predniSONE  (DELTASONE ) 10 MG tablet, TAKE 2 TABLETS BY MOUTH DAILY WITH BREAKFAST, Disp: 60 tablet, Rfl: 1   sulfamethoxazole -trimethoprim  (BACTRIM  DS) 800-160 MG tablet, Take 1 tablet by mouth 3 (three) times a week., Disp: 12 tablet, Rfl: 5   Varenicline  Tartrate, Starter, (CHANTIX  STARTING MONTH PAK) 0.5 MG X 11 & 1 MG X 42 TBPK, Take 1 0.5 mg tablet once daily for 3 days, increase to 1  0.5 mg tablet twice daily for 4 days, increase to 1 1 mg tablet twice daily., Disp: 53 each, Rfl: 0   Medications ordered in this encounter:  Meds ordered this encounter  Medications   naproxen  (NAPROSYN ) 500 MG tablet    Sig: Take 1 tablet (500 mg total) by mouth 2 (two) times daily with a meal for 7 days.    Dispense:  14 tablet    Refill:  0   doxycycline  (VIBRA -TABS) 100 MG tablet    Sig: Take 1 tablet (100 mg total) by mouth 2 (two) times daily for 7 days.    Dispense:  14 tablet    Refill:  0     *If you need refills on other medications prior to your next appointment, please contact your  pharmacy*  Follow-Up: Call back or seek an in-person evaluation if the symptoms worsen or if the condition fails to improve as anticipated.  [facility] [phone]  Other Instructions Sinus Infection, Adult A sinus infection, also called sinusitis, is inflammation of your sinuses. Sinuses are hollow spaces in the bones around your face. Your sinuses are located: Around your eyes. In the middle of your forehead. Behind your nose. In your cheekbones. Mucus normally drains out of your sinuses. When your nasal tissues become inflamed or swollen, mucus can become trapped or blocked. This allows bacteria, viruses, and fungi to grow, which leads to infection. Most infections of the sinuses are caused by a virus. A sinus infection can develop quickly. It can last for up to 4 weeks (acute) or for more than 12 weeks (chronic). A sinus infection often develops after a cold. What are the causes? This condition is caused by anything that creates swelling in the sinuses or stops mucus from draining. This includes: Allergies. Asthma. Infection from bacteria or viruses. Deformities or blockages in your nose or sinuses. Abnormal growths in the nose (nasal polyps). Pollutants, such as chemicals or irritants in the air. Infection from fungi. This is rare. What increases the risk? You are more likely to develop this condition if you: Have a weak body defense system (immune system). Do a lot of swimming or diving. Overuse nasal sprays. Smoke. What are the signs or symptoms? The main symptoms of this condition are pain and a feeling of pressure around the affected sinuses. Other symptoms include: Stuffy nose or congestion that makes it difficult to breathe through your nose. Thick yellow or greenish drainage from your nose. Tenderness, swelling, and warmth over the affected sinuses. A cough that may get worse at night. Decreased sense of smell and taste. Extra mucus that collects in  the throat or the back of the nose (postnasal drip) causing a sore throat or bad breath. Tiredness (fatigue). Fever. How is this diagnosed? This condition is diagnosed based on: Your symptoms. Your medical history. A physical exam. Tests to find out if your condition is acute or chronic. This may include: Checking your nose for nasal polyps. Viewing your sinuses using a device that has a light (endoscope). Testing for allergies or bacteria. Imaging tests, such as an MRI or CT scan. In rare cases, a bone biopsy may be done to rule out more serious types of fungal sinus disease. How is this treated? Treatment for a sinus infection depends on the cause and whether your condition is chronic or acute. If caused by a virus, your symptoms should go away on their own within 10 days. You may be given medicines to relieve symptoms.  They include: Medicines that shrink swollen nasal passages (decongestants). A spray that eases inflammation of the nostrils (topical intranasal corticosteroids). Rinses that help get rid of thick mucus in your nose (nasal saline washes). Medicines that treat allergies (antihistamines). Over-the-counter pain relievers. If caused by bacteria, your health care provider may recommend waiting to see if your symptoms improve. Most bacterial infections will get better without antibiotic medicine. You may be given antibiotics if you have: A severe infection. A weak immune system. If caused by narrow nasal passages or nasal polyps, surgery may be needed. Follow these instructions at home: Medicines Take, use, or apply over-the-counter and prescription medicines only as told by your health care provider. These may include nasal sprays. If you were prescribed an antibiotic medicine, take it as told by your health care provider. Do not stop taking the antibiotic even if you start to feel better. Hydrate and humidify  Drink enough fluid to keep your urine pale yellow. Staying  hydrated will help to thin your mucus. Use a cool mist humidifier to keep the humidity level in your home above 50%. Inhale steam for 10-15 minutes, 3-4 times a day, or as told by your health care provider. You can do this in the bathroom while a hot shower is running. Limit your exposure to cool or dry air. Rest Rest as much as possible. Sleep with your head raised (elevated). Make sure you get enough sleep each night. General instructions  Apply a warm, moist washcloth to your face 3-4 times a day or as told by your health care provider. This will help with discomfort. Use nasal saline washes as often as told by your health care provider. Wash your hands often with soap and water to reduce your exposure to germs. If soap and water are not available, use hand sanitizer. Do not smoke. Avoid being around people who are smoking (secondhand smoke). Keep all follow-up visits. This is important. Contact a health care provider if: You have a fever. Your symptoms get worse. Your symptoms do not improve within 10 days. Get help right away if: You have a severe headache. You have persistent vomiting. You have severe pain or swelling around your face or eyes. You have vision problems. You develop confusion. Your neck is stiff. You have trouble breathing. These symptoms may be an emergency. Get help right away. Call 911. Do not wait to see if the symptoms will go away. Do not drive yourself to the hospital. Summary A sinus infection is soreness and inflammation of your sinuses. Sinuses are hollow spaces in the bones around your face. This condition is caused by nasal tissues that become inflamed or swollen. The swelling traps or blocks the flow of mucus. This allows bacteria, viruses, and fungi to grow, which leads to infection. If you were prescribed an antibiotic medicine, take it as told by your health care provider. Do not stop taking the antibiotic even if you start to feel better. Keep  all follow-up visits. This is important. This information is not intended to replace advice given to you by your health care provider. Make sure you discuss any questions you have with your health care provider. Document Revised: 11/24/2020 Document Reviewed: 11/24/2020 Elsevier Patient Education  2024 [employer]   If you have been instructed to have an in-person evaluation today at a local Urgent Care facility, please use the link below. It will take you to a list of all of our available [facility], including address, phone number  and hours of operation. Please do not delay care.  [facility]  If you or a family member do not have a primary care provider, use the link below to schedule a visit and establish care. When you choose a [facility] primary care physician or advanced practice provider, you gain a long-term partner in health. Find a Primary Care Provider  Learn more about [facility] in-office and virtual care options: [facility] Get Care Now

## 2023-09-20 ENCOUNTER — Ambulatory Visit
Admission: RE | Admit: 2023-09-20 | Discharge: 2023-09-20 | Disposition: A | Discharge status: Home / Self Care | Source: Ambulatory Visit | Attending: Pulmonary Disease | Admitting: Pulmonary Disease

## 2023-09-20 ENCOUNTER — Encounter: Payer: Self-pay | Admitting: Pulmonary Disease

## 2023-09-20 DIAGNOSIS — J849 Interstitial pulmonary disease, unspecified: Secondary | ICD-10-CM

## 2023-09-25 ENCOUNTER — Encounter (INDEPENDENT_AMBULATORY_CARE_PROVIDER_SITE_OTHER): Admitting: Neurology

## 2023-09-25 DIAGNOSIS — R269 Unspecified abnormalities of gait and mobility: Secondary | ICD-10-CM | POA: Diagnosis not present

## 2023-09-25 DIAGNOSIS — M48061 Spinal stenosis, lumbar region without neurogenic claudication: Secondary | ICD-10-CM

## 2023-09-25 DIAGNOSIS — G6289 Other specified polyneuropathies: Secondary | ICD-10-CM

## 2023-09-25 MED ORDER — DULOXETINE HCL 30 MG PO CPEP: 30.0000 mg | ORAL_CAPSULE | Freq: Every day | ORAL | 6 refills | Status: DC

## 2023-09-25 NOTE — Telephone Encounter (Signed)
Please see the MyChart message reply(ies) for my assessment and plan.    This patient gave consent for this Medical Advice Message and is aware that it may result in a bill to [employer], as well as the possibility of receiving a bill for a co-payment or deductible. They are an established patient, but are not seeking medical advice exclusively about a problem treated during an in person or video visit in the last seven days. I did not recommend an in person or video visit within seven days of my reply.    I spent a total of 10 minutes cumulative time within 7 days through [employer].  Marcial Pacas, MD

## 2023-09-25 NOTE — Addendum Note (Signed)
 Addended by: Tellis Spivak on: 09/25/2023 05:31 PM   Modules accepted: Orders

## 2023-09-26 ENCOUNTER — Telehealth: Payer: Self-pay | Admitting: Neurology

## 2023-09-26 NOTE — Telephone Encounter (Signed)
 Referral for pain clinic fax to [facility]. Phone: [phone], Fax: [phone]

## 2023-09-29 ENCOUNTER — Ambulatory Visit: Payer: Self-pay | Admitting: Pulmonary Disease

## 2023-10-02 ENCOUNTER — Ambulatory Visit: Admitting: Pulmonary Disease

## 2023-10-02 ENCOUNTER — Ambulatory Visit

## 2023-10-02 ENCOUNTER — Ambulatory Visit: Admitting: Adult Health

## 2023-10-02 ENCOUNTER — Encounter: Payer: Self-pay | Admitting: Adult Health

## 2023-10-02 VITALS — BP 136/91 | HR 75 | Temp 98.3°F | Ht 72.0 in | Wt 190.0 lb

## 2023-10-02 DIAGNOSIS — J849 Interstitial pulmonary disease, unspecified: Secondary | ICD-10-CM

## 2023-10-02 DIAGNOSIS — J8417 Interstitial lung disease with progressive fibrotic phenotype in diseases classified elsewhere: Secondary | ICD-10-CM

## 2023-10-02 DIAGNOSIS — M0510 Rheumatoid lung disease with rheumatoid arthritis of unspecified site: Secondary | ICD-10-CM

## 2023-10-02 DIAGNOSIS — R0602 Shortness of breath: Secondary | ICD-10-CM

## 2023-10-02 DIAGNOSIS — F17210 Nicotine dependence, cigarettes, uncomplicated: Secondary | ICD-10-CM

## 2023-10-02 DIAGNOSIS — M069 Rheumatoid arthritis, unspecified: Secondary | ICD-10-CM

## 2023-10-02 DIAGNOSIS — Z5181 Encounter for therapeutic drug level monitoring: Secondary | ICD-10-CM

## 2023-10-02 LAB — CBC WITH DIFFERENTIAL/PLATELET
Basophils Absolute: 0.1 K/uL (ref 0.0–0.1)
Basophils Relative: 0.4 % (ref 0.0–3.0)
Eosinophils Absolute: 0 K/uL (ref 0.0–0.7)
Eosinophils Relative: 0.1 % (ref 0.0–5.0)
HCT: 39.9 % (ref 39.0–52.0)
Hemoglobin: 13.1 g/dL (ref 13.0–17.0)
Lymphocytes Relative: 12.1 % (ref 12.0–46.0)
Lymphs Abs: 1.7 K/uL (ref 0.7–4.0)
MCHC: 32.9 g/dL (ref 30.0–36.0)
MCV: 93.2 fl (ref 78.0–100.0)
Monocytes Absolute: 1 K/uL (ref 0.1–1.0)
Monocytes Relative: 7.2 % (ref 3.0–12.0)
Neutro Abs: 11.3 K/uL — ABNORMAL HIGH (ref 1.4–7.7)
Neutrophils Relative %: 80.2 % — ABNORMAL HIGH (ref 43.0–77.0)
Platelets: 580 K/uL — ABNORMAL HIGH (ref 150.0–400.0)
RBC: 4.29 Mil/uL (ref 4.22–5.81)
RDW: 17.5 % — ABNORMAL HIGH (ref 11.5–15.5)
WBC: 14.1 K/uL — ABNORMAL HIGH (ref 4.0–10.5)

## 2023-10-02 LAB — COMPREHENSIVE METABOLIC PANEL WITH GFR
ALT: 22 U/L (ref 0–53)
AST: 19 U/L (ref 0–37)
Albumin: 3.9 g/dL (ref 3.5–5.2)
Alkaline Phosphatase: 165 U/L — ABNORMAL HIGH (ref 39–117)
BUN: 10 mg/dL (ref 6–23)
CO2: 30 meq/L (ref 19–32)
Calcium: 9.7 mg/dL (ref 8.4–10.5)
Chloride: 96 meq/L (ref 96–112)
Creatinine, Ser: 0.77 mg/dL (ref 0.40–1.50)
GFR: 95.32 mL/min (ref >60.00)
Glucose, Bld: 100 mg/dL — ABNORMAL HIGH (ref 70–99)
Potassium: 3.8 meq/L (ref 3.5–5.1)
Sodium: 135 meq/L (ref 135–145)
Total Bilirubin: 0.4 mg/dL (ref 0.2–1.2)
Total Protein: 7.5 g/dL (ref 6.0–8.3)

## 2023-10-02 LAB — PULMONARY FUNCTION TEST
DL/VA % pred: 76 %
DL/VA: 3.16 ml/min/mmHg/L
DLCO unc % pred: 52 %
DLCO unc: 15.09 ml/min/mmHg
FEF 25-75 Post: 3.18 L/s
FEF 25-75 Pre: 2.63 L/s
FEF2575-%Change-Post: 21 %
FEF2575-%Pred-Post: 105 %
FEF2575-%Pred-Pre: 87 %
FEV1-%Change-Post: 4 %
FEV1-%Pred-Post: 71 %
FEV1-%Pred-Pre: 68 %
FEV1-Post: 2.69 L
FEV1-Pre: 2.58 L
FEV1FVC-%Change-Post: 1 %
FEV1FVC-%Pred-Pre: 106 %
FEV6-%Change-Post: 3 %
FEV6-%Pred-Post: 68 %
FEV6-%Pred-Pre: 66 %
FEV6-Post: 3.29 L
FEV6-Pre: 3.18 L
FEV6FVC-%Change-Post: 0 %
FEV6FVC-%Pred-Post: 104 %
FEV6FVC-%Pred-Pre: 104 %
FVC-%Change-Post: 2 %
FVC-%Pred-Post: 66 %
FVC-%Pred-Pre: 64 %
FVC-Post: 3.31 L
FVC-Pre: 3.22 L
Post FEV1/FVC ratio: 81 %
Post FEV6/FVC ratio: 99 %
Pre FEV1/FVC ratio: 80 %
Pre FEV6/FVC Ratio: 99 %
RV % pred: 130 %
RV: 3.15 L
TLC % pred: 86 %
TLC: 6.4 L

## 2023-10-02 MED ORDER — SULFAMETHOXAZOLE-TRIMETHOPRIM 800-160 MG PO TABS: 1.0000 | ORAL_TABLET | ORAL | 5 refills | Status: AC

## 2023-10-02 NOTE — Progress Notes (Signed)
 @Patient  ID: Fernando Lloyd, male    DOB: August 06, 1960, 63 y.o.   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  No feather pillows or comforters ILD questionnaire 01/26/2022-negative Smoking history: 1 pack/day for 42 years.  Continues to smoke Travel history: Previously lived in [place] .  No significant recent travel Relevant family history: No family history of lung disease  TEST/EVENTS :  High-resolution CT chest September 20, 2023 shows progressive interstitial lung disease with increased ground glass component with interlobular thickening and bronchiectasis involving the upper and lower lobes.  Findings suggestive of an alternative diagnosis not UIP Discussed the use of AI scribe software for clinical note transcription with the patient, who gave verbal consent to proceed.  History of Present Illness Fernando Lloyd is a 63 year old male with rheumatoid arthritis associated  interstitial lung disease who presents for 3 month follow up management of his conditions.  He has been experiencing worsening symptoms of rheumatoid arthritis, including joint pain and flare-ups, leading to an increase in his prednisone  dosage. He is currently taking prednisone  40 mg daily  previous medications include Humira, Arava, methotrexate, and Rinvoq, with Rinvoq being  particularly effective. He currently taking CellCept  1500mg  Twice daily  (mycophenolate ). Has stopped taking Bactrim  three times a week while back.  Last visit was recommended to follow up with Rheumatology but misunderstood instructions.  Set up for HRCT completed on September 17 that showed progressive interstitial lung disease with increased ground glass component findings are suggestive of an alternative diagnosis. He was set up for pulmonary function testing that was completed today that shows a decline in lung function with FEV1 decreased at 71%, FVC decreased to 66% and diffusing capacity decreased from 60% to 52%.SABRA He experiences daily cough and shortness of breath and is not on home oxygen. He continues to smoke.  Walk test today in office shows no signal desaturations on room air.  He experiences aches and pains, particularly in his back and legs, and has a history of spinal stenosis. He describes intermittent sharp pain in his lower back and legs, which he associates with nerve issues.  Patient is currently following with neurology for ongoing issues       Allergies  Allergen Reactions   Other     ROSES (flower)    Other Reaction(s): Sneezing and itchiness  ROSES (flower)   Other Reaction(s): Sneezing and itchiness   Penicillin V Potassium     Other Reaction(s): Unknown    Immunization History  Administered Date(s) Administered   PFIZER(Purple Top)SARS-COV-2 Vaccination 03/16/2019, 04/09/2019    Past Medical History:  Diagnosis Date   Anxiety    Ataxia    Hyperlipidemia    Hypertension    Myocardial infarct (HCC) 11/23/2014   RCA stent 3.5x23 DES in [place]   Rheumatoid arthritis (HCC)     Tobacco History: Social History   Tobacco Use  Smoking Status Every Day   Current  packs/day: 1.00   Average packs/day: 1 pack/day for 43.0 years (43.0 ttl pk-yrs)   Types: Cigarettes  Smokeless Tobacco Never  Tobacco Comments   1- 1.5 packs daily 06/09/22/lt   Ready to quit:  Not Answered Counseling given: Not Answered Tobacco comments: 1- 1.5 packs daily 06/09/22/lt   Outpatient Medications Prior to Visit  Medication Sig Dispense Refill   albuterol  (PROVENTIL ) (2.5 MG/3ML) 0.083% nebulizer solution Take 3 mLs (2.5 mg total) by nebulization every 4 (four) hours as needed. 75 mL 12   albuterol  (VENTOLIN  HFA) 108 (90 Base) MCG/ACT inhaler INHALE 2 PUFFS BY MOUTH EVERY 4 HOURS AS NEEDED FOR WHEEZE OR FOR SHORTNESS OF BREATH 18 each 2   ALPRAZolam (XANAX) 0.25 MG tablet Take 0.25 mg by mouth See admin instructions. Take 0.25 mg by mouth one to two times a day as needed for anxiety     Budeson-Glycopyrrol-Formoterol (BREZTRI  AEROSPHERE) 160-9-4.8 MCG/ACT AERO Inhale 2 puffs into the lungs 2 (two) times daily. 10.7 g 11   buPROPion  (WELLBUTRIN  SR) 150 MG 12 hr tablet Take 1 tablet (150 mg total) by mouth daily. 90 tablet 1   DULoxetine  (CYMBALTA ) 30 MG capsule Take 1 capsule (30 mg total) by mouth daily. 30 capsule 6   famotidine  (PEPCID ) 20 MG tablet ONE AFTER SUPPER 90 tablet 3   folic acid (FOLVITE) 1 MG tablet 1 tablet Orally Once a day for 60 days     gabapentin  (NEURONTIN ) 300 MG capsule Take 1 capsule (300 mg total) by mouth 3 (three) times daily. 90 capsule 11   isosorbide  mononitrate (IMDUR ) 60 MG 24 hr tablet Take 1 tablet (60 mg total) by mouth daily. 30 tablet 0   leflunomide (ARAVA) 20 MG tablet Take 20 mg by mouth daily.     mycophenolate  (CELLCEPT ) 500 MG tablet TAKE 1500MG  TWICE DAILY 540 tablet 1   mycophenolate  (CELLCEPT ) 500 MG tablet TAKE 2 TABLETS BY MOUTH TWICE A DAY 360 tablet 1   mycophenolate  (CELLCEPT ) 500 MG tablet TAKE 2 TABLETS BY MOUTH TWICE A DAY 360 tablet 1   nicotine  (NICODERM CQ  - DOSED IN MG/24 HOURS) 14 mg/24hr patch Place 1 patch (14 mg total) onto the skin daily. 28 patch 0   pantoprazole  (PROTONIX ) 40 MG tablet TAKE 1 TABLET (40 MG TOTAL) BY MOUTH DAILY. TAKE 30-60 MIN BEFORE FIRST MEAL OF THE DAY 90 tablet 3   predniSONE  (DELTASONE )  10 MG tablet TAKE 2 TABLETS BY MOUTH DAILY WITH BREAKFAST 60 tablet 1   sulfamethoxazole -trimethoprim  (BACTRIM  DS) 800-160 MG tablet Take 1 tablet by mouth 3 (three) times a week. 12 tablet 5   Varenicline  Tartrate, Starter, (CHANTIX  STARTING MONTH PAK) 0.5 MG X 11 & 1 MG X 42 TBPK Take 1 0.5 mg tablet once daily for 3 days, increase to 1 0.5 mg tablet twice daily for 4 days, increase to 1 1 mg tablet twice daily. 53 each 0   No facility-administered medications prior to visit.     Review of Systems:   Constitutional:   No  weight loss, night sweats,  Fevers, chills, +fatigue, or  lassitude.  HEENT:   No headaches,  Difficulty swallowing,  Tooth/dental problems, or  Sore throat,                No sneezing, itching, ear ache, nasal congestion, post nasal drip,   CV:  No chest pain,  Orthopnea, PND, swelling in lower extremities, anasarca, dizziness, palpitations, syncope.   GI  No heartburn, indigestion, abdominal pain,  nausea, vomiting, diarrhea, change in bowel habits, loss of appetite, bloody stools.   Resp:    No chest wall deformity  Skin: no rash or lesions.  GU: no dysuria, change in color of urine, no urgency or frequency.  No flank pain, no hematuria   MS:  +joint pains    Physical Exam    GEN: A/Ox3; pleasant , NAD, well nourished    HEENT:  [place]/AT,    NOSE-clear, THROAT-clear, no lesions, no postnasal drip or exudate noted.   NECK:  Supple w/ fair ROM; no JVD; normal carotid impulses w/o bruits; no thyromegaly or nodules palpated; no lymphadenopathy.    RESP  BB crackles  no accessory muscle use, no dullness to percussion  CARD:  RRR, no m/r/g, no peripheral edema, pulses intact, no cyanosis or clubbing.  GI:   Soft & nt; nml bowel sounds; no organomegaly or masses detected.   Musco: Warm bil, no deformities or joint swelling noted.   Neuro: alert, no focal deficits noted.    Skin: Warm, no lesions or rashes    Lab Results:  CBC+   BNP    Component  Value Date/Time   BNP 61.2 10/25/2019 1241    ProBNP    Component Value Date/Time   PROBNP 136 01/24/2022 1615    Imaging: CT CHEST HIGH RESOLUTION Result Date: 09/25/2023 CLINICAL DATA:  Interstitial lung disease. EXAM: CT CHEST WITHOUT CONTRAST TECHNIQUE: Multidetector CT imaging of the chest was performed following the standard protocol without intravenous contrast. High resolution imaging of the lungs, as well as inspiratory and expiratory imaging, was performed. RADIATION DOSE REDUCTION: This exam was performed according to the departmental dose-optimization program which includes automated exposure control, adjustment of the mA and/or kV according to patient size and/or use of iterative reconstruction technique. COMPARISON:  Chest CT October 10, 2022 FINDINGS: Cardiovascular: The heart size is normal. Atherosclerotic calcifications of coronary arteries and aorta. Ascending aorta measures 4 cm. No pleural effusion. Mediastinum/Nodes: Few subcentimeter mediastinal lymph nodes. Thyroid gland, trachea and esophagus are unremarkable. Lungs/Pleura: Progressive interstitial lung disease with increased ground-glass component interlobular thickening and bronchiectasis/bronchiolectasis involving both upper and lower lobes, central and peripheral. No new or suspicious nodule or consolidation. No significant air trapping on expiratory phase. No pleural effusion. Trachea and major airways are patent. Upper Abdomen: Unremarkable Musculoskeletal: No chest wall mass or suspicious bone lesions identified. IMPRESSION: Progressive interstitial lung disease with increased ground-glass attenuation without any honeycombing suggestive of active inflammatory process. Findings are suggestive of an alternative diagnosis (not UIP) per consensus guidelines including rheumatoid lung disease: Diagnosis of Idiopathic Pulmonary Fibrosis: An Official ATS/ERS/JRS/ALAT Clinical Practice Guideline. Am JINNY [employer] Crit Care Med Vol 198,  Iss 5, [phone], Sep 03 2016. Aneurysmal dilation of ascending aorta, stable to prior. Atherosclerotic calcification of aorta and coronary arteries. Electronically Signed   By: Megan  Zare M.D.   On: 09/25/2023 18:25   NCV with EMG(electromyography) Result Date: 09/07/2023 Onita Duos, MD     09/07/2023  5:47 PM     Full Name: Demar Shad Gender: Male MRN #: [id number] Date of Birth: Nov 04, 1960   Visit Date: 09/07/2023 16:19 Age: 36 Years Examining Physician: Onita Duos Referring Physician: Onita Duos Height: 6 feet 1 inch History: 63 year old male presenting with new onset of gait abnormality for 3 years, initially mainly due to right leg weakness, acute worsening in past few weeks with noticeable left leg weakness Summary of the test: Nerve conduction study: Bilateral sural, superficial peroneal sensory responses were  absent.  Right ulnar sensory response showed mild to moderately decreased snap amplitude, right radial sensory response was normal. Right peroneal motor responses were within normal limit.  Left peroneal motor responses showed mildly decreased CMAP amplitude at distal stimulation side. Bilateral tibial motor responses were within normal limit. Left ulnar motor responses were normal. Electromyography: Selected needle examination of bilateral lower extremity muscle, lumbosacral paraspinal muscles were performed. There is evidence of mild chronic neuropathic changes involving bilateral L4-5 myotomes.  There was no active denervation at bilateral lumbosacral paraspinal muscles.  The left lower lumbar paraspinals were enlarged, mildly polyphasic Conclusion: This is an abnormal study.  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 ------------------------------- Modena Callander. M.D. Ph.D. [facility] [address]  [place], [place] [address] Tel: [phone] Fax: [phone] Verbal informed consent was obtained from the patient, patient was informed of potential risk of procedure, including bruising, bleeding, hematoma formation, infection, muscle weakness, muscle pain, numbness, among others.     MNC   Nerve / Sites Muscle Latency Ref. Amplitude Ref. Rel Amp Segments Distance Velocity Ref. Area   ms ms mV mV %  cm m/s m/s mVms L Ulnar - ADM    Wrist ADM 2.8 <=3.3 6.7 >=6.0 100 Wrist - ADM 7   17.9    B.Elbow ADM 6.0  6.5  97.5 B.Elbow - Wrist 18 57 >=49 16.4    A.Elbow ADM 8.8  5.9  90 A.Elbow - B.Elbow 15 52 >=49 16.0 L Peroneal - EDB    Ankle EDB 5.9 <=6.5 2.1 >=2.0 100 Ankle - EDB 9   7.9    Fib head EDB 13.1  2.4  114 Fib head - Ankle 29 40 >=44 10.6    Pop fossa EDB 15.6  2.0  83.6 Pop fossa - Fib head 10 41 >=44 8.7        Acc Peron - Pop fossa     R Peroneal - EDB    Ankle EDB 5.6 <=6.5 1.5 >=2.0 100 Ankle - EDB 9   4.9    Fib head EDB 13.1  1.3  84 Fib head - Ankle 29 39 >=44 5.1    Pop fossa EDB 15.6  3.1  248 Pop fossa - Fib head 8 31 >=44 10.7        Acc Peron - Pop fossa     L Tibial - AH    Ankle AH 5.3 <=5.8 7.8 >=4.0 100 Ankle - AH 9   19.5    Pop fossa AH 17.0  5.6  72.4 Pop fossa - Ankle 48 41 >=41 16.5 R Tibial - AH    Ankle AH 4.5 <=5.8 10.7 >=4.0 100 Ankle - AH 9   23.3    Pop fossa AH 16.1  6.0  56.2 Pop fossa - Ankle 41 35 >=41 17.8               SNC   Nerve / Sites Rec. Site Peak Lat Ref.  Amp Ref. Segments Distance   ms ms V V  cm R Radial - Anatomical snuff box (Forearm)    Forearm Wrist 2.1 <=2.9 17 >=15 Forearm - Wrist 10 R Sural - Ankle (Calf)    Calf Ankle NR <=4.4 NR >=6 Calf - Ankle 14 L Sural - Ankle (Calf)    Calf Ankle NR <=4.4 NR >=6 Calf - Ankle 14  R Superficial peroneal - Ankle    Lat leg Ankle NR <=4.4 NR >=6 Lat leg - Ankle 14 L Superficial peroneal - Ankle    Lat leg Ankle NR <=4.4 NR >=6 Lat leg - Ankle 14 R Ulnar - Orthodromic, (Dig V, Mid palm)    Dig V Wrist 3.0 <=3.1 3 >=5 Dig V -  Wrist 38               F  Wave   Nerve F Lat Ref.  ms ms L Tibial - AH 57.0 <=56.0 R Tibial - AH 56.4 <=56.0 L Ulnar - ADM 30.9 <=32.0         EMG Summary Table   Spontaneous MUAP Recruitment Muscle IA Fib PSW Fasc Other Amp Dur. Poly Pattern R. Tibialis anterior Normal None None None _______ Normal Normal Normal Reduced R. Tibialis posterior Normal None None None _______ Normal Normal Normal Reduced R. Peroneus longus Normal None None None _______ Normal Normal Normal Reduced R. Gastrocnemius (Medial head) Normal None None None _______ Normal Normal Normal Reduced R. Vastus lateralis Normal None None None _______ Increased Increased 1+ Reduced R. Biceps femoris (short head) Normal None None None _______ Normal Normal Normal Normal R. Gluteus medius Normal None None None _______ Normal Normal Normal Normal R. Lumbar paraspinals (low) Normal None None None _______ Increased Increased Normal Normal R. Lumbar paraspinals (mid) Normal None None None _______ Increased Increased 1+ Normal L. Tibialis posterior Normal None None None _______ Normal Normal Normal Reduced L. Tibialis anterior Normal None None None _______ Normal Normal Normal Reduced L. Peroneus longus Normal None None None _______ Normal Normal Normal Reduced L. Gastrocnemius (Medial head) Normal None None None _______ Normal Normal Normal Reduced L. Vastus lateralis Normal None None None _______ Increased Increased 1+ Reduced L. Lumbar paraspinals (low) Normal None None None _______ Normal Normal Normal Normal L. Lumbar paraspinals (mid) Normal None None None _______ Normal Normal Normal Normal     gadobenate dimeglumine  (MULTIHANCE ) injection 15 mL     Date Action Dose Route User   08/08/2023 1633 Contrast Given 15 mL Intravenous (Right Arm) Cindie Moats R          Latest Ref Rng & Units 10/02/2023   12:29 PM 10/11/2022   10:52 AM 01/24/2022   11:14 AM  PFT Results  FVC-Pre L 3.22  P 3.54  3.29   FVC-Predicted Pre % 64  P 70  64   FVC-Post  L 3.31  P 3.65  3.36   FVC-Predicted Post % 66  P 72  66   Pre FEV1/FVC % % 80  P 81  81   Post FEV1/FCV % % 81  P 82  83   FEV1-Pre L 2.58  P 2.86  2.66   FEV1-Predicted Pre % 68  P 75  69   FEV1-Post L 2.69  P 3.01  2.78   DLCO uncorrected ml/min/mmHg 15.09  P 17.45  17.02   DLCO UNC% % 52  P 60  58   DLCO corrected ml/min/mmHg  17.45  17.02   DLCO COR %Predicted %  60  58   DLVA Predicted % 76  P 81  81   TLC L 6.40  P 5.46  5.60   TLC % Predicted % 86  P 73  75   RV % Predicted % 130  P 83  100     P Preliminary result    No results found for: NITRICOXIDE  Assessment & Plan:   Assessment and Plan Assessment & Plan 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 In the setting of ongoing smoking. Discussed case with Dr. Theophilus, will need  Collaboration with rheumatology is essential to optimize RA treatment.  Refer to rheumatology to re-establish for management of rheumatoid arthritis. Order an echocardiogram to assess pulmonary artery pressures. Encourage smoking cessation .   Rheumatoid arthritis   Rheumatoid arthritis is not well-controlled, possibly contributing to the progression of interstitial lung disease. Current medications, CellCept  and prednisone , Collaboration with rheumatology is needed to optimize treatment The goal is to reduce prednisone  to minimize side effects while maintaining control of rheumatoid arthritis and lung fibrosis. Refer to rheumatology for reassessment and management. Coordinate with rheumatology to explore alternative therapies.   Chronic prednisone  therapy management   Chronic prednisone  use is at a high dose of 40 mg daily, raising concerns about side effects such as muscle wasting, bone loss, increased diabetes risk, and neuropathy. The goal is to reduce the dose to minimize these risks while maintaining control of rheumatoid arthritis and  lung fibrosis. Decrease prednisone  to 30 mg daily for one week, then reduce to 20 mg daily. Monitor for symptoms of rheumatoid arthritis and lung fibrosis during dose reduction. Steroid education   Chronic mycophenolate  mofetil (CellCept ) therapy management   CellCept  is used for managing rheumatoid arthritis and associated lung disease. Current dosing is 1500 mg twice daily,  Labs today .   PJP -prophylaxis management   Bactrim  is prescribed as prophylaxis against infections due to immunosuppression from high-dose prednisone  and other immunosuppressants.s. Restart Bactrim  three times a week as prophylaxis against infections.  Chronic tobacco use   Chronic tobacco can contribute to the decline in lung function   Smoking cessation is crucial to prevent further deterioration and reduce cardiovascular risk. Encourage smoking cessation, suggesting gradual reduction by one cigarette per week.  Plan  Patient Instructions  Follow back up with Rheumatology for your Rheumatoid Arthritis  Decrease Prednisone  30mg  daily for 1 week, then 20mg  daily -hold at this dose.  Restart Bactrim  three days a week, Monday, Wednesday, Friday.  Got to quit smoking.  Albuterol  inhaler or neb As needed   Mucinex DM Twice daily  As needed  cough/congestion  Set up Echo  Labs today.  Follow up in 6-8 weeks with Dr. Theophilus or Idalee Foxworthy NP and As needed   Please contact office for sooner follow up if symptoms do not improve or worsen or seek emergency care              Madelin Stank, NP 10/02/2023

## 2023-10-02 NOTE — Telephone Encounter (Signed)
 Patient was seen today by Tammy parrett Np. NFN

## 2023-10-02 NOTE — Progress Notes (Signed)
 Full pft performed today

## 2023-10-02 NOTE — Patient Instructions (Signed)
 Full pft performed today

## 2023-10-02 NOTE — Patient Instructions (Addendum)
 Follow back up with Rheumatology for your Rheumatoid Arthritis  Decrease Prednisone  30mg  daily for 1 week, then 20mg  daily -hold at this dose.  Restart Bactrim  three days a week, Monday, Wednesday, Friday.  Got to quit smoking.  Albuterol  inhaler or neb As needed   Mucinex DM Twice daily  As needed  cough/congestion  Set up Echo  Labs today.  Follow up in 6-8 weeks with Dr. Theophilus or Amarrah Meinhart NP and As needed   Please contact office for sooner follow up if symptoms do not improve or worsen or seek emergency care

## 2023-10-03 ENCOUNTER — Ambulatory Visit: Payer: Self-pay | Admitting: Adult Health

## 2023-10-04 NOTE — Telephone Encounter (Signed)
 ATC x1.  LVM to return call regarding lab results.

## 2023-10-09 ENCOUNTER — Encounter: Payer: Self-pay | Admitting: *Deleted

## 2023-10-09 NOTE — Progress Notes (Signed)
 Called and spoke with patient, provided results/recommendations per Madelin Stank NP.  He verbalized understanding.  He said he is having trouble with transportation and cannot get a ride to his pcp or rheumatologist right now.  He will and schedule something.    He said the PFT that he did not show a true reflection of his lung function, on the day of the test he was congested and he is still congested.  He is asking if he can do the test again at a later date to get true results of his lung function.  He is to see Dr. Theophilus at the end of November, nothing scheduled yet.  Do you want him to have a repeat PFT prior to seeing Dr. Theophilus?  Please advise.  Thank you

## 2023-10-09 NOTE — Progress Notes (Signed)
My chart message sent to patient per his request

## 2023-10-13 ENCOUNTER — Other Ambulatory Visit: Payer: Self-pay | Admitting: Pulmonary Disease

## 2023-10-13 DIAGNOSIS — M0510 Rheumatoid lung disease with rheumatoid arthritis of unspecified site: Secondary | ICD-10-CM

## 2023-10-18 ENCOUNTER — Other Ambulatory Visit: Payer: Self-pay | Admitting: Neurology

## 2023-10-20 NOTE — Telephone Encounter (Signed)
 I called the pt to discuss and there was no answer- LMTCB.

## 2023-10-20 NOTE — Telephone Encounter (Signed)
 Walk test at the office showed no desaturations so would not qualify for oxygen if he is having a flare we can definitely get him back into take a look and see if oxygen levels are dropping or if he needs further treatment.  Please check my rheumatology referral as he was to reestablish with [facility] Rosaline Salt PA or MD who he had previously seen that is written in my order on October 02, 2023 can we check on this to see if this appointment can be made sooner since he was an established patient

## 2023-10-27 ENCOUNTER — Ambulatory Visit: Attending: Cardiology

## 2023-10-27 DIAGNOSIS — M0510 Rheumatoid lung disease with rheumatoid arthritis of unspecified site: Secondary | ICD-10-CM | POA: Diagnosis not present

## 2023-10-27 DIAGNOSIS — R0602 Shortness of breath: Secondary | ICD-10-CM | POA: Diagnosis not present

## 2023-10-30 ENCOUNTER — Inpatient Hospital Stay (HOSPITAL_COMMUNITY): Admit: 2023-10-30 | Admitting: Student in an Organized Health Care Education/Training Program

## 2023-10-30 ENCOUNTER — Encounter (HOSPITAL_COMMUNITY): Payer: Self-pay

## 2023-10-30 LAB — ECHOCARDIOGRAM COMPLETE
AV Mean grad: 6 mmHg
AV Peak grad: 11.5 mmHg
Ao pk vel: 1.69 m/s
Area-P 1/2: 3.03 cm2
P 1/2 time: 748 ms
S' Lateral: 3.8 cm

## 2023-11-01 ENCOUNTER — Other Ambulatory Visit: Payer: Self-pay | Admitting: *Deleted

## 2023-11-01 ENCOUNTER — Telehealth: Payer: Self-pay | Admitting: *Deleted

## 2023-11-01 DIAGNOSIS — M0510 Rheumatoid lung disease with rheumatoid arthritis of unspecified site: Secondary | ICD-10-CM

## 2023-11-01 DIAGNOSIS — M069 Rheumatoid arthritis, unspecified: Secondary | ICD-10-CM

## 2023-11-01 DIAGNOSIS — N200 Calculus of kidney: Secondary | ICD-10-CM

## 2023-11-01 NOTE — Telephone Encounter (Signed)
 Dr. Theophilus, The patient/spouse are asking for a referral to a urologist.  He has been hospitalized with a stroke and while he was at the hospital, it was found that he had kidney stones that have been causing a lot of his pain.  Please advise on urology referral.  Thank you.

## 2023-11-05 NOTE — Telephone Encounter (Signed)
 Okay to place a urology referral.

## 2023-11-06 NOTE — Telephone Encounter (Signed)
 ATC the pt x1. Unable to leave vm due to full mailbox.  ATC x1 the pts wife, no answer- left vm stating referral has been placed.  Referral placed.

## 2023-11-07 NOTE — Telephone Encounter (Signed)
 Called patient,patient is aware.NFN

## 2023-11-14 ENCOUNTER — Telehealth: Payer: Self-pay | Admitting: *Deleted

## 2023-11-16 NOTE — Telephone Encounter (Signed)
 Please review lab work and scan in care everywhere patient had a stroke did lab work and ct scans in chart and wanted to make you aware as an FYI

## 2023-11-21 NOTE — Progress Notes (Unsigned)
 Chief Complaint: No chief complaint on file.   History of Present Illness:  Fernando Lloyd is a 63 y.o. male who is seen in consultation from Stamey, Chiquita POUR, FNP for evaluation of ***.   Past Medical History:  Past Medical History:  Diagnosis Date   Anxiety    Ataxia    Hyperlipidemia    Hypertension    Myocardial infarct (HCC) 11/23/2014   RCA stent 3.5x23 DES in [place]   Rheumatoid arthritis [facility])     Past Surgical History:  Past Surgical History:  Procedure Laterality Date   CARDIAC CATHETERIZATION N/A 11/23/2014   CARPAL TUNNEL RELEASE     CORONARY ANGIOPLASTY     2016   CORONARY ANGIOPLASTY WITH STENT PLACEMENT      Allergies:  Allergies  Allergen Reactions   Other     ROSES (flower)    Other Reaction(s): Sneezing and itchiness  ROSES (flower)   Other Reaction(s): Sneezing and itchiness    Family History:  Family History  Problem Relation Age of Onset   Dementia Mother    Breast cancer Mother    Other Father        unsure of history    Social History:  Social History   Tobacco Use   Smoking status: Every Day    Current packs/day: 1.00    Average packs/day: 1 pack/day for 43.0 years (43.0 ttl pk-yrs)    Types: Cigarettes   Smokeless tobacco: Never   Tobacco comments:    1- 1.5 packs daily 06/09/22/lt  Vaping Use   Vaping status: Never Used  Substance Use Topics   Alcohol use: Yes    Alcohol/week: 12.0 standard drinks of alcohol    Types: 12 Cans of beer per week   Drug use: Never    Review of symptoms:  Constitutional:  Negative for unexplained weight loss, night sweats, fever, chills ENT:  Negative for nose bleeds, sinus pain, painful swallowing CV:  Negative for chest pain, shortness of breath, exercise intolerance, palpitations, loss of consciousness Resp:  Negative for cough, wheezing, shortness of breath GI:  Negative for nausea, vomiting, diarrhea, bloody stools GU:  Positives noted in HPI; otherwise negative for gross  hematuria, dysuria, urinary incontinence Neuro:  Negative for seizures, poor balance, limb weakness, slurred speech Psych:  Negative for lack of energy, depression, anxiety Endocrine:  Negative for polydipsia, polyuria, symptoms of hypoglycemia (dizziness, hunger, sweating) Hematologic:  Negative for anemia, purpura, petechia, prolonged or excessive bleeding, use of anticoagulants  Allergic:  Negative for difficulty breathing or choking as a result of exposure to anything; no shellfish allergy; no allergic response (rash/itch) to materials, foods  Physical exam: There were no vitals taken for this visit. GENERAL APPEARANCE:  Well appearing, well developed, well nourished, NAD HEENT: Atraumatic, Normocephalic. NECK: Normal appearance LUNGS: Normal inspiratory and expiratory excursion HEART: Regular Rate ABDOMEN: ***. GU: Phallus normal, no lesions. Scrotal skin normal. Testicles/epididymal structures normal. Meatus normal. Normal anal sphincter tone, prostate ***mL, symmetric, non nodular, non tender. EXTREMITIES: Moves all extremities well.  Without clubbing, cyanosis, or edema. NEUROLOGIC:  Alert and oriented x 3, normal gait, CN II-XII grossly intact.  MENTAL STATUS:  Appropriate. SKIN:  Warm, dry and intact.    Results: No results found for this or any previous visit (from the past 24 hours).  I have reviewed referring/prior physicians notes  I have reviewed urinalysis  I have reviewed PSA results  I have reviewed prior imaging  I have reviewed urine culture results  Assessment: ***   Plan: ***

## 2023-11-22 ENCOUNTER — Ambulatory Visit (INDEPENDENT_AMBULATORY_CARE_PROVIDER_SITE_OTHER): Admitting: Urology

## 2023-11-22 VITALS — BP 193/99 | HR 106 | Ht 73.0 in | Wt 185.0 lb

## 2023-11-22 DIAGNOSIS — N132 Hydronephrosis with renal and ureteral calculous obstruction: Secondary | ICD-10-CM | POA: Diagnosis not present

## 2023-11-22 DIAGNOSIS — N200 Calculus of kidney: Secondary | ICD-10-CM

## 2023-11-22 DIAGNOSIS — F17210 Nicotine dependence, cigarettes, uncomplicated: Secondary | ICD-10-CM | POA: Diagnosis not present

## 2023-11-22 DIAGNOSIS — N1330 Unspecified hydronephrosis: Secondary | ICD-10-CM

## 2023-11-22 LAB — URINALYSIS, ROUTINE W REFLEX MICROSCOPIC
Bilirubin, UA: NEGATIVE
Glucose, UA: NEGATIVE
Ketones, UA: NEGATIVE
Leukocytes,UA: NEGATIVE
Nitrite, UA: NEGATIVE
Protein,UA: NEGATIVE
RBC, UA: NEGATIVE
Specific Gravity, UA: 1.015 (ref 1.005–1.030)
Urobilinogen, Ur: 0.2 mg/dL (ref 0.2–1.0)
pH, UA: 6 (ref 5.0–7.5)

## 2023-11-22 NOTE — Telephone Encounter (Signed)
 Pt saw urology today.

## 2023-11-25 ENCOUNTER — Ambulatory Visit (INDEPENDENT_AMBULATORY_CARE_PROVIDER_SITE_OTHER)
Admission: RE | Admit: 2023-11-25 | Discharge: 2023-11-25 | Disposition: A | Discharge status: Home / Self Care | Source: Ambulatory Visit | Attending: Urology | Admitting: Urology

## 2023-11-25 DIAGNOSIS — N200 Calculus of kidney: Secondary | ICD-10-CM

## 2023-11-28 ENCOUNTER — Other Ambulatory Visit (HOSPITAL_BASED_OUTPATIENT_CLINIC_OR_DEPARTMENT_OTHER): Admitting: Radiology

## 2023-12-04 ENCOUNTER — Ambulatory Visit: Payer: Self-pay | Admitting: Urology

## 2023-12-08 ENCOUNTER — Other Ambulatory Visit (HOSPITAL_BASED_OUTPATIENT_CLINIC_OR_DEPARTMENT_OTHER): Admitting: Radiology

## 2023-12-08 ENCOUNTER — Ambulatory Visit (HOSPITAL_BASED_OUTPATIENT_CLINIC_OR_DEPARTMENT_OTHER)
Admission: RE | Admit: 2023-12-08 | Discharge: 2023-12-08 | Disposition: A | Discharge status: Home / Self Care | Source: Ambulatory Visit | Attending: Urology | Admitting: Urology

## 2023-12-08 DIAGNOSIS — N200 Calculus of kidney: Secondary | ICD-10-CM

## 2023-12-14 ENCOUNTER — Ambulatory Visit: Admitting: Pulmonary Disease

## 2023-12-14 ENCOUNTER — Encounter: Payer: Self-pay | Admitting: Pulmonary Disease

## 2023-12-14 ENCOUNTER — Telehealth: Payer: Self-pay | Admitting: Pulmonary Disease

## 2023-12-14 MED ORDER — PREDNISONE 10 MG PO TABS: 40.0000 mg | ORAL_TABLET | Freq: Every day | ORAL | 6 refills | Status: DC

## 2023-12-14 NOTE — Progress Notes (Deleted)
 Discussed the use of AI scribe software for clinical note transcription with the patient, who gave verbal consent to proceed.  History of Present Illness Fernando Lloyd is a 63 year old male who presents for routine lung imaging follow-up due to a history of smoking.  Pulmonary symptoms and function - No dyspnea, cough, or other breathing issues - Recent pulmonary function tests were normal  Tobacco use history - Prior history of smoking - Quit smoking over four years ago  Family history of lung cancer - Grandfather had lung cancer - Desires routine imaging due to family history  Physical activity and conditioning - Resumed regular exercise, including running and weightlifting - Working to improve physical conditioning  Assessment and Plan Assessment & Plan Lung cancer screening He quit smoking over four years ago with a family history of lung cancer (grandfather diagnosed in 60). Pulmonary function tests are normal, and he is asymptomatic. Given his smoking history and family history, lung cancer screening is considered. Current guidelines suggest annual screening for those with significant smoking history, but his case is atypical due to limited smoking history and recent cessation. Screening every two to three years is deemed appropriate. - Schedule lung cancer screening every two to three years. - Contact the office to schedule follow-up imaging. - If abnormalities are detected, contact the provider for further evaluation.

## 2023-12-14 NOTE — Telephone Encounter (Signed)
 PCCM telephone  Patient called to report that as the prednisone  is getting tapered down he is getting increased joint pain.  Currently on 20 mg of prednisone  and wants to go back up to 40 mg of prednisone  until he can keep his appointment with rheumatology which is scheduled for early January.  New prescription for prednisone  sent to pharmacy.  Meryem Haertel MD [facility] See Amion for pager  If no response to pager , please call [phone] until 7pm After 7:00 pm call Elink  [phone] 12/14/2023, 1:55 PM

## 2023-12-29 ENCOUNTER — Other Ambulatory Visit: Payer: Self-pay | Admitting: Pulmonary Disease

## 2023-12-29 DIAGNOSIS — M0510 Rheumatoid lung disease with rheumatoid arthritis of unspecified site: Secondary | ICD-10-CM

## 2024-01-10 NOTE — Progress Notes (Unsigned)
 "  Office Visit Note  Patient: Fernando Lloyd             Date of Birth: 29-Dec-1960           MRN: [id number]             PCP: Crecencio Chiquita POUR, FNP Referring: Orlie Madelin RAMAN, NP Visit Date: 01/11/2024 Occupation: Data Unavailable  Subjective:  No chief complaint on file.   History of Present Illness: Fernando Lloyd is a 64 y.o. male ***     Activities of Daily Living:  Patient reports morning stiffness for 30-45 minutes.   Patient Reports nocturnal pain.  Difficulty dressing/grooming: Reports Difficulty climbing stairs: Reports Difficulty getting out of chair: Reports Difficulty using hands for taps, buttons, cutlery, and/or writing: Reports  Review of Systems  Constitutional:  Positive for fatigue.  HENT:  Positive for mouth dryness. Negative for mouth sores.   Eyes:  Negative for dryness.  Respiratory:  Positive for shortness of breath.   Cardiovascular:  Positive for chest pain. Negative for palpitations.  Gastrointestinal:  Positive for constipation and diarrhea. Negative for blood in stool.  Endocrine: Negative for increased urination.  Genitourinary:  Negative for involuntary urination.  Musculoskeletal:  Positive for joint pain, gait problem, joint pain, joint swelling, myalgias, muscle weakness, morning stiffness, muscle tenderness and myalgias.  Skin:  Positive for color change. Negative for rash, hair loss and sensitivity to sunlight.  Allergic/Immunologic: Negative for susceptible to infections.  Neurological:  Positive for dizziness and headaches.  Hematological:  Negative for swollen glands.  Psychiatric/Behavioral:  Positive for depressed mood and sleep disturbance. The patient is nervous/anxious.     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   Cigarette smoker 10/22/2021   Cerebrovascular accident (CVA) (HCC) 11/15/2018   Coronary artery disease involving native coronary artery of native heart without angina pectoris 06/14/2018    Past Medical History:  Diagnosis Date   Anxiety    Ataxia    Hyperlipidemia    Hypertension    Myocardial infarct (HCC) 11/23/2014   RCA stent 3.5x23 DES in [place]   Rheumatoid arthritis (HCC)     Family History  Problem Relation Age of Onset   Dementia Mother    Breast cancer Mother    Other Father        unsure of history   Past Surgical History:  Procedure Laterality Date   CARDIAC CATHETERIZATION N/A 11/23/2014   CARPAL TUNNEL RELEASE     CORONARY ANGIOPLASTY     2016   CORONARY ANGIOPLASTY WITH STENT PLACEMENT     Social History[1] Social History   Social History Narrative   Lives at home with his wife.   Right-handed.   1-2 cups per day.     Immunization History  Administered Date(s) Administered   PFIZER(Purple Top)SARS-COV-2 Vaccination 03/16/2019, 04/09/2019     Objective: Vital Signs: There were no vitals taken for this visit.   Physical Exam   Musculoskeletal Exam: ***  CDAI Exam: CDAI Score: -- Patient Global: --; Provider Global: -- Swollen: --; Tender: -- Joint Exam 01/11/2024   No joint exam has been documented for this visit   There is currently no information documented on the homunculus. Go to the Rheumatology activity  and complete the homunculus joint exam.  Investigation: No additional findings.  Imaging: No results found.  Recent Labs: Lab Results  Component Value Date   WBC 14.1 (H) 10/02/2023   HGB 13.1 10/02/2023   PLT 580.0 (H) 10/02/2023   NA 135 10/02/2023   K 3.8 10/02/2023   CL 96 10/02/2023   CO2 30 10/02/2023   GLUCOSE 100 (H) 10/02/2023   BUN 10 10/02/2023   CREATININE 0.77 10/02/2023   BILITOT 0.4 10/02/2023   ALKPHOS 165 (H) 10/02/2023   AST 19 10/02/2023    ALT 22 10/02/2023   PROT 7.5 10/02/2023   ALBUMIN 3.9 10/02/2023   CALCIUM 9.7 10/02/2023    Speciality Comments: No specialty comments available.  Procedures:  No procedures performed Allergies: Other   Assessment / Plan:     Visit Diagnoses: No diagnosis found.  Orders: No orders of the defined types were placed in this encounter.  No orders of the defined types were placed in this encounter.   Face-to-face time spent with patient was *** minutes. Greater than 50% of time was spent in counseling and coordination of care.  Follow-Up Instructions: No follow-ups on file.   Alfonso Patterson, LPN  Note - This record has been created using [employer].  Chart creation errors have been sought, but may not always  have been located. Such creation errors do not reflect on  the standard of medical care.     [1]  Social History Tobacco Use   Smoking status: Every Day    Current packs/day: 1.00    Average packs/day: 1 pack/day for 43.0 years (43.0 ttl pk-yrs)    Types: Cigarettes   Smokeless tobacco: Never   Tobacco comments:    1- 1.5 packs daily 06/09/22/lt  Vaping Use   Vaping status: Never Used  Substance Use Topics   Alcohol use: Yes    Alcohol/week: 12.0 standard drinks of alcohol    Types: 12 Cans of beer per week   Drug use: Never   "

## 2024-01-11 ENCOUNTER — Ambulatory Visit
Admission: RE | Admit: 2024-01-11 | Discharge: 2024-01-11 | Disposition: A | Discharge status: Home / Self Care | Source: Ambulatory Visit

## 2024-01-11 ENCOUNTER — Ambulatory Visit

## 2024-01-11 VITALS — BP 163/93 | HR 92 | Temp 98.9°F | Resp 12 | Ht 72.5 in | Wt 192.8 lb

## 2024-01-11 DIAGNOSIS — M069 Rheumatoid arthritis, unspecified: Secondary | ICD-10-CM | POA: Diagnosis present

## 2024-01-11 DIAGNOSIS — Z111 Encounter for screening for respiratory tuberculosis: Secondary | ICD-10-CM | POA: Insufficient documentation

## 2024-01-11 DIAGNOSIS — Z79899 Other long term (current) drug therapy: Secondary | ICD-10-CM | POA: Diagnosis present

## 2024-01-11 DIAGNOSIS — M79604 Pain in right leg: Secondary | ICD-10-CM

## 2024-01-11 DIAGNOSIS — Z1159 Encounter for screening for other viral diseases: Secondary | ICD-10-CM | POA: Diagnosis present

## 2024-01-11 DIAGNOSIS — M7989 Other specified soft tissue disorders: Secondary | ICD-10-CM | POA: Insufficient documentation

## 2024-01-11 MED ORDER — PREDNISONE 20 MG PO TABS: 40.0000 mg | ORAL_TABLET | Freq: Every day | ORAL | 0 refills | Status: DC

## 2024-01-14 LAB — CBC
HCT: 45.1 % (ref 39.4–51.1)
Hemoglobin: 14.8 g/dL (ref 13.2–17.1)
MCH: 30.5 pg (ref 27.0–33.0)
MCHC: 32.8 g/dL (ref 31.6–35.4)
MCV: 93 fL (ref 81.4–101.7)
MPV: 10.9 fL (ref 7.5–12.5)
Platelets: 423 Thousand/uL — ABNORMAL HIGH (ref 140–400)
RBC: 4.85 Million/uL (ref 4.20–5.80)
RDW: 13.4 % (ref 11.0–15.0)
WBC: 11.2 Thousand/uL — ABNORMAL HIGH (ref 3.8–10.8)

## 2024-01-14 LAB — AST: AST: 21 U/L (ref 10–35)

## 2024-01-14 LAB — ALT: ALT: 20 U/L (ref 9–46)

## 2024-01-14 LAB — HEPATITIS C ANTIBODY: Hepatitis C Ab: NONREACTIVE

## 2024-01-14 LAB — CREATININE, SERUM: Creat: 0.81 mg/dL (ref 0.70–1.35)

## 2024-01-14 LAB — QUANTIFERON-TB GOLD PLUS
Mitogen-NIL: 1.17 [IU]/mL
NIL: 0.03 [IU]/mL
QuantiFERON-TB Gold Plus: NEGATIVE
TB1-NIL: <0 [IU]/mL
TB2-NIL: 0.02 [IU]/mL

## 2024-01-14 LAB — HEPATITIS B CORE ANTIBODY, IGM: Hep B C IgM: NONREACTIVE

## 2024-01-14 LAB — HEPATITIS B SURFACE ANTIGEN: Hepatitis B Surface Ag: NONREACTIVE

## 2024-01-16 NOTE — Progress Notes (Signed)
 "  Office Visit Note  Patient: Fernando Lloyd             Date of Birth: [birth year]           MRN: [id number]             PCP: Crecencio Chiquita POUR, FNP Referring: Crecencio Chiquita POUR, FNP Visit Date: 01/26/2024 Occupation: Data Unavailable  Subjective:  Medication Management (Patient requesting referral to Dr. Juliane Billing for an injection in his back.)   History of Present Illness: Fernando Lloyd is a 64 y.o. male  with hx of RA and ILD who presented as a new patient on 01/11/24 to establish care. At that encounter, initial work-up was consistent with RA and discussion was had regarding medication regimen adjustments.  Since his last visit, patient continues to admit to significant joint pain, joint swelling, and prolonged AM stiffness. His biggest complaint continues to be left sided low back pain, unrelated to his RA.  He denies any new symptoms since his last visit. He continues to take Arava 20mg  daily, Cellcept  1500mg  BID, Prednisone  40mg  daily. He denies any missed doses or side effects.  Discussed lab results and x-rays from his last visit.   Activities of Daily Living:  Patient reports morning stiffness for 30 minutes.   Patient Reports nocturnal pain.  Difficulty dressing/grooming: Denies Difficulty climbing stairs: Reports Difficulty getting out of chair: Reports Difficulty using hands for taps, buttons, cutlery, and/or writing: Reports  Review of Systems  Constitutional:  Positive for fatigue.  HENT:  Positive for mouth dryness. Negative for mouth sores.   Eyes:  Positive for dryness.  Respiratory:  Positive for shortness of breath.   Cardiovascular:  Positive for chest pain. Negative for palpitations.  Gastrointestinal:  Negative for blood in stool, constipation and diarrhea.  Endocrine: Negative for increased urination.  Genitourinary:  Negative for involuntary urination.  Musculoskeletal:  Positive for joint pain, gait problem, joint pain, joint swelling,  myalgias, muscle weakness, morning stiffness and myalgias. Negative for muscle tenderness.  Skin:  Negative for color change, rash, hair loss and sensitivity to sunlight.  Allergic/Immunologic: Negative for susceptible to infections.  Neurological:  Positive for dizziness. Negative for headaches.  Hematological:  Negative for swollen glands.  Psychiatric/Behavioral:  Positive for depressed mood and sleep disturbance. The patient is nervous/anxious.     PMFS History:  Patient Active Problem List   Diagnosis Date Noted   Peripheral neuropathy 09/07/2023   Spinal stenosis of lumbar region 09/07/2023   Gait abnormality 05/18/2023   Incontinence of feces 05/18/2023   Asthmatic bronchitis , chronic (HCC) 11/19/2021   Rheumatoid lung disease (HCC) 10/22/2021   Cigarette smoker 10/22/2021   Cerebrovascular accident (CVA) (HCC) 11/15/2018   Coronary artery disease involving native coronary artery of native heart without angina pectoris 06/14/2018    Past Medical History:  Diagnosis Date   Anxiety    Ataxia    Hyperlipidemia    Hypertension    ILD (interstitial lung disease) (HCC)    Myocardial infarct (HCC) 11/23/2014   RCA stent 3.5x23 DES in [place]   Pulmonary fibrosis (HCC)    Rheumatoid arthritis (HCC)    Spinal stenosis    Stroke (HCC)     Family History  Problem Relation Age of Onset   Dementia Mother    Breast cancer Mother    Alcohol abuse Father    Heart attack Father    Alcohol abuse Sister    Alcohol abuse Sister    Past Surgical History:  Procedure Laterality Date   CARDIAC CATHETERIZATION N/A 11/23/2014   CARPAL TUNNEL RELEASE     CORONARY ANGIOPLASTY     2016   CORONARY ANGIOPLASTY WITH STENT PLACEMENT     Social History[1] Social History   Social History Narrative   Lives at home with his wife.   Right-handed.   1-2 cups per day.     Immunization History  Administered Date(s) Administered   PFIZER(Purple Top)SARS-COV-2 Vaccination 03/16/2019,  04/09/2019     Objective: Vital Signs: BP (!) 149/70   Pulse 76   Temp 98 F (36.7 C)   Resp 14   Ht 6' 0.5 (1.842 m)   Wt 191 lb 3.2 oz (86.7 kg)   BMI 25.57 kg/m    Physical Exam Vitals and nursing note reviewed.  HENT:     Head: Normocephalic and atraumatic.     Nose: Nose normal.  Eyes:     Conjunctiva/sclera: Conjunctivae normal.     Pupils: Pupils are equal, round, and reactive to light.  Pulmonary:     Effort: Pulmonary effort is normal. No respiratory distress.  Skin:    General: Skin is warm and dry.  Neurological:     Mental Status: He is alert. Mental status is at baseline.  Psychiatric:        Mood and Affect: Mood normal.        Behavior: Behavior normal.      Musculoskeletal Exam:   CDAI Exam: CDAI Score: -- Patient Global: --; Provider Global: -- Swollen: 8 ; Tender: 8  Joint Exam 01/26/2024      Right  Left  MCP 2  Swollen Tender  Swollen Tender  MCP 3  Swollen Tender  Swollen Tender  MCP 4  Swollen Tender  Swollen Tender  PIP 2 (finger)  Swollen Tender  Swollen Tender     Investigation: No additional findings.  Imaging: DG Ankle 2 Views Right Result Date: 01/18/2024 CLINICAL DATA:  Joint pain EXAM: RIGHT ANKLE - 2 VIEW COMPARISON:  None Available. FINDINGS: There is no evidence of fracture, dislocation, or joint effusion. There is no evidence of arthropathy or other focal bone abnormality. Soft tissues are unremarkable. IMPRESSION: Negative. Electronically Signed   By: Luke Bun M.D.   On: 01/18/2024 20:05   DG Foot 2 Views Right Result Date: 01/18/2024 CLINICAL DATA:  Rheumatoid joint pain EXAM: DG FOOT 2V*R* COMPARISON:  None Available. FINDINGS: Mineralization is normal. There is no fracture or malalignment. Soft tissues are unremarkable. No osseous erosive change IMPRESSION: Negative. Electronically Signed   By: Luke Bun M.D.   On: 01/18/2024 20:05   DG Foot 2 Views Left Result Date: 01/18/2024 CLINICAL DATA:  Arthritis joint  pain EXAM: LEFT FOOT - 2 VIEW COMPARISON:  None Available. FINDINGS: No fracture or malalignment. Mineralization appears within normal limits. Possible old fracture deformity of the fifth proximal phalanx. 3 mm triangular opacity projecting over the heel pad. Mineralization is normal. No definitive erosions. IMPRESSION: 1. No acute osseous abnormality. 2. 3 mm triangular opacity projecting over the heel pad, possible foreign body. Electronically Signed   By: Luke Bun M.D.   On: 01/18/2024 20:04   DG Ankle 2 Views Left Result Date: 01/18/2024 CLINICAL DATA:  Joint pain EXAM: LEFT ANKLE - 2 VIEW COMPARISON:  None Available. FINDINGS: There is no evidence of fracture, dislocation, or joint effusion. There is no evidence of arthropathy or other focal bone abnormality. 3 mm triangular opacity projects over the heel pad on the lateral  view. IMPRESSION: 1. No acute osseous abnormality. 2. 3 mm triangular opacity projects over the heel pad on the lateral view, possible foreign body. Electronically Signed   By: Luke Bun M.D.   On: 01/18/2024 20:03   DG Hand 2 View Left Result Date: 01/18/2024 CLINICAL DATA:  Rheumatoid joint pain EXAM: LEFT HAND - 2 VIEW COMPARISON:  None Available. FINDINGS: Mineralization is within normal limits. Mild joint space narrowing and osteophyte at the first IP joint. Minimal degenerative change at first [facility] joint. No definitive erosions. Soft tissues are unremarkable IMPRESSION: Mild degenerative changes at the first IP and CMC joints. Electronically Signed   By: Luke Bun M.D.   On: 01/18/2024 20:02   DG Hand 2 View Right Result Date: 01/18/2024 CLINICAL DATA:  Rheumatoid arthritis. EXAM: RIGHT HAND - 2 VIEW COMPARISON:  None Available. FINDINGS: No acute fracture or dislocation. Old healed fracture deformity of the fifth metacarpal with foreshortening. No significant arthritic changes. The soft tissues are unremarkable. IMPRESSION: 1. No acute findings. 2. Old healed  fracture deformity of the fifth metacarpal. Electronically Signed   By: Vanetta Chou M.D.   On: 01/18/2024 20:00   VAS US  LOWER EXTREMITY VENOUS (DVT) Result Date: 01/11/2024  Lower Venous DVT Study Patient Name:  XZAVIAR MALOOF  Date of Exam:   01/11/2024 Medical Rec #: [id number]           Accession #:    [id number] Date of Birth: 1960-01-08           Patient Gender: M Patient Age:   41 years Exam Location:  [address] Procedure:      VAS US  LOWER EXTREMITY VENOUS (DVT) Referring Phys: Aspyn Warnke --------------------------------------------------------------------------------  Indications: Pain. Other Indications: Pain right lower leg. Risk Factors: SOB and chest pain, current smoker. Anticoagulation: 81 mg ASA. Comparison Study: None Performing Technologist: [employer] BS, RVT, RDCS  Examination Guidelines: A complete evaluation includes B-mode imaging, spectral Doppler, color Doppler, and power Doppler as needed of all accessible portions of each vessel. Bilateral testing is considered an integral part of a complete examination. Limited examinations for reoccurring indications may be performed as noted. The reflux portion of the exam is performed with the patient in reverse Trendelenburg.  +---------+---------------+---------+-----------+----------+--------------+ RIGHT    CompressibilityPhasicitySpontaneityPropertiesThrombus Aging +---------+---------------+---------+-----------+----------+--------------+ CFV      Full           Yes      Yes                                 +---------+---------------+---------+-----------+----------+--------------+ SFJ      Full           Yes      Yes                                 +---------+---------------+---------+-----------+----------+--------------+ FV Prox  Full           Yes      Yes                                 +---------+---------------+---------+-----------+----------+--------------+ FV Mid   Full           Yes       Yes                                 +---------+---------------+---------+-----------+----------+--------------+  FV DistalFull           Yes      Yes                                 +---------+---------------+---------+-----------+----------+--------------+ PFV      Full           Yes      Yes                                 +---------+---------------+---------+-----------+----------+--------------+ POP      Full           Yes      Yes                                 +---------+---------------+---------+-----------+----------+--------------+ PTV      Full           Yes      Yes                                 +---------+---------------+---------+-----------+----------+--------------+ PERO     Full           Yes      Yes                                 +---------+---------------+---------+-----------+----------+--------------+ Gastroc  Full                                                        +---------+---------------+---------+-----------+----------+--------------+ GSV      Full           Yes      Yes                                 +---------+---------------+---------+-----------+----------+--------------+   +----+---------------+---------+-----------+----------+--------------+ LEFTCompressibilityPhasicitySpontaneityPropertiesThrombus Aging +----+---------------+---------+-----------+----------+--------------+ CFV Full           Yes      Yes                                 +----+---------------+---------+-----------+----------+--------------+    Findings reported to Dr. Luba at 12:15PM.  Summary: RIGHT: - There is no evidence of deep vein thrombosis in the lower extremity. - There is no evidence of superficial venous thrombosis.  - No cystic structure found in the popliteal fossa.  LEFT: - No evidence of common femoral vein obstruction.   *See table(s) above for measurements and observations. Electronically signed by Fonda Rim on 01/11/2024 at  1:31:50 PM.    Final     Recent Labs: Lab Results  Component Value Date   WBC 11.2 (H) 01/11/2024   HGB 14.8 01/11/2024   PLT 423 (H) 01/11/2024   NA 135 10/02/2023   K 3.8 10/02/2023   CL 96 10/02/2023   CO2 30 10/02/2023   GLUCOSE 100 (H) 10/02/2023   BUN 10 10/02/2023   CREATININE 0.81 01/11/2024   BILITOT  0.4 10/02/2023   ALKPHOS 165 (H) 10/02/2023   AST 21 01/11/2024   ALT 20 01/11/2024   PROT 7.5 10/02/2023   ALBUMIN 3.9 10/02/2023   CALCIUM 9.7 10/02/2023   QFTBGOLDPLUS NEGATIVE 01/11/2024    Speciality Comments: No specialty comments available.  Procedures:  No procedures performed Allergies: Other   Assessment / Plan:     Visit Diagnoses: Rheumatoid arthritis, involving unspecified site, unspecified whether rheumatoid factor present (HCC) ILD Pt w/ long-standing hx of RA w/ ILD, currently on Arava, Cellcept , and Prednisone . Given his active inflammatory arthritis is currently uncontrolled at this time, he needs more aggressive DMARD therapy. Given his ILD, we cannot utilize Methotrexate or TNFi, so the next step would be Tociliuzmab (approved for RA-ILD and RA).   After discussion with pulmonologist, will continue Cellcept  1500mg  BID and start Actemra. Patient instructed to continue prednisone  with taper as documented below. He was also instructed to STOP Arava prior to starting Actemra.   After discussion with patient, will initiate Tocilizumab. As discussed with patient, Tocilizumab can be administered either as an injection under the skin or as an intravenous infusion. When it is injected under the skin, it can be used every week or every other week, usually given in the skin of the abdomen or the thighs. When it is given as an infusion, it is given once every 4 weeks. Discussed that Tocilizumab can compromise their immune system and increase risk of infections, including Shingles. It can lower your cell counts, including your white blood cell count. Additional  side effects include, but are not limited to, increased liver function tests, change/worsening of lipid panel, diverticular perforation, reactivation of TB. Patient denies history of diverticulitis and instructed to stop medication and inform doctor immediately if they develop abdominal pain or bloody bowel movements. Patient also was advised to hold medication if significantly ill or on an antibiotic. QuantiFERON and hepatitis will be checked. ACR handout given to patient to review and encouraged to ask questions.  Patient advised they will need labs every 8 weeks.   Discussed steroid side effects including weight gain, decreased bone density/fractures, risk for infection, avascular necrosis, hypertension, hyperglycemia, dyspepsia/gastric ulcers, fluid retention, weakness, bruising, acne, cataracts, insomnia, mood problems. Patient also instructed to take this medication with food, and not to take this medication with NSAIDs such as Aleve  or Ibuprofen.    High risk medication use Long term corticosteroid use Patient on Cellcept  and starting Actemra as discussed above. Toxicity labs obtained 01/11/24, next due 04/2024.  Ulcer prophylaxis: Protonix  PJP prophylaxis: Bactrim   Spinal stenosis of lumbar region, unspecified whether neurogenic claudication present - Plan: Ambulatory referral to Orthopedic Surgery  Orders: Orders Placed This Encounter  Procedures   Ambulatory referral to Orthopedic Surgery   Meds ordered this encounter  Medications   predniSONE  (DELTASONE ) 10 MG tablet    Sig: Take 4 tablets (40 mg total) by mouth daily with breakfast for 14 days, THEN 3 tablets (30 mg total) daily with breakfast for 14 days, THEN 2.5 tablets (25 mg total) daily with breakfast.    Dispense:  248 tablet    Refill:  0   tiZANidine  (ZANAFLEX ) 4 MG tablet    Sig: Take 1 tablet (4 mg total) by mouth at bedtime.    Dispense:  14 tablet    Refill:  0    I personally spent a total of 40 minutes in the care  of the patient today including preparing to see the patient, getting/reviewing separately obtained history,  performing a medically appropriate exam/evaluation, counseling and educating, placing orders, documenting clinical information in the EHR, independently interpreting results, and communicating results.   Follow-Up Instructions: Return in about 1 month (around 02/26/2024).   Asberry Claw, DO  Note - This record has been created using Animal nutritionist.  Chart creation errors have been sought, but may not always  have been located. Such creation errors do not reflect on  the standard of medical care.     [1]  Social History Tobacco Use   Smoking status: Every Day    Current packs/day: 1.00    Average packs/day: 1 pack/day for 43.0 years (43.0 ttl pk-yrs)    Types: Cigarettes    Passive exposure: Never   Smokeless tobacco: Never   Tobacco comments:    1- 1.5 packs daily 06/09/22/lt  Vaping Use   Vaping status: Never Used  Substance Use Topics   Alcohol use: Yes    Alcohol/week: 35.0 standard drinks of alcohol    Types: 35 Cans of beer per week   Drug use: Never   "

## 2024-01-26 ENCOUNTER — Ambulatory Visit

## 2024-01-26 ENCOUNTER — Telehealth: Payer: Self-pay

## 2024-01-26 VITALS — BP 149/70 | HR 76 | Temp 98.0°F | Resp 14 | Ht 72.5 in | Wt 191.2 lb

## 2024-01-26 DIAGNOSIS — M069 Rheumatoid arthritis, unspecified: Secondary | ICD-10-CM

## 2024-01-26 DIAGNOSIS — Z79899 Other long term (current) drug therapy: Secondary | ICD-10-CM

## 2024-01-26 DIAGNOSIS — M48061 Spinal stenosis, lumbar region without neurogenic claudication: Secondary | ICD-10-CM

## 2024-01-26 MED ORDER — TIZANIDINE HCL 4 MG PO TABS: 4.0000 mg | ORAL_TABLET | Freq: Every day | ORAL | 0 refills | Status: AC

## 2024-01-26 MED ORDER — PREDNISONE 10 MG PO TABS: ORAL_TABLET | ORAL | 0 refills | Status: AC

## 2024-01-26 NOTE — Telephone Encounter (Signed)
 Per Dr. Luba, pt will be Actemra/Tyenne new start. She is fine with whichever formulation that the insurance will pay for. Will submit PA once OV note has been finalized.

## 2024-01-26 NOTE — Patient Instructions (Signed)
 Vaccines You are taking a medication(s) that can suppress your immune system.  The following immunizations are recommended: Flu annually Covid-19  Td/Tdap (tetanus, diphtheria, pertussis) every 10 years Pneumonia (Prevnar 15 then Pneumovax 23 at least 1 year apart.  Alternatively, can take Prevnar 20 without needing additional dose) Shingrix: 2 doses from 4 weeks to 6 months apart  Please check with your PCP to make sure you are up to date.      If you have signs or symptoms of an infection or start antibiotics: First, call your PCP for workup of your infection. Hold your medication through the infection, until you complete your antibiotics, and until symptoms resolve if you take the following: Injectable medication (Actemra, Benlysta, Cimzia, Cosentyx, Enbrel, Humira, Kevzara, Orencia, Remicade, Simponi, Stelara, Taltz, Tremfya) Methotrexate Leflunomide (Arava) Mycophenolate (Cellcept) Earma, Olumiant, or Rinvoq     Heart Disease Prevention   Your inflammatory disease increases your risk of heart disease which includes heart attack, stroke, atrial fibrillation (irregular heartbeats), high blood pressure, heart failure and atherosclerosis (plaque in the arteries).  It is important to reduce your risk by:   Keep blood pressure, cholesterol, and blood sugar at healthy levels   Smoking Cessation   Maintain a healthy weight  BMI 20-25   Eat a healthy diet  Plenty of fresh fruit, vegetables, and whole grains  Limit saturated fats, foods high in sodium, and added sugars  DASH and Mediterranean diet   Increase physical activity  Recommend moderate physically activity for 150 minutes per week/ 30 minutes a day for five days a week These can be broken up into three separate ten-minute sessions during the day.   Reduce Stress  Meditation, slow breathing exercises, yoga, coloring books  Dental visits twice a year      IF YOU HAVE A SURGERY SCHEDULED: Hold the medication dose  that is due BEFORE your surgery if you take the following: Injectable medication (Actemra, Benlysta, Cimzia, Cosentyx, Enbrel, Humira, Kevzara, Orencia, Remicade, Rituxan, Simponi, Stelara, Taltz, Tremfya) Methotrexate Leflunomide (Arava) Mycophenolate (Cellcept) Hold 3 days before surgery: Earma, Rinvoq, Olumiant Hold 1 week before surgery: azathioprine (Imuran), mycophenolate (CellCept), methotrexate, leflunomide You do NOT have to hold: hydroxychloroquine (Plaquenil), sulfasalazine, or Otezla  AFTER YOUR SURGERY, you will need clearance by your SURGEON to resume your medication and have no signs of infection. They may send us  a note or call our clinic to provide clearance.

## 2024-01-31 ENCOUNTER — Encounter: Payer: Self-pay | Admitting: Pulmonary Disease

## 2024-01-31 ENCOUNTER — Ambulatory Visit: Admitting: Pulmonary Disease

## 2024-02-01 NOTE — Telephone Encounter (Signed)
 FYI

## 2024-02-02 NOTE — Telephone Encounter (Signed)
 Submitted a Prior Authorization request to [employer] for TYENNE SQ via CoverMyMeds. Will update once we receive a response.  Key: [id number]

## 2024-02-08 ENCOUNTER — Other Ambulatory Visit: Payer: Self-pay

## 2024-02-08 NOTE — Telephone Encounter (Signed)
 Please advise.

## 2024-03-29 ENCOUNTER — Ambulatory Visit
